# Patient Record
Sex: Male | Born: 1955
Health system: Southern US, Community
[De-identification: ages and names within clinical notes are randomized; demographics above are authoritative.]

## PROBLEM LIST (undated history)

## (undated) DIAGNOSIS — E785 Hyperlipidemia, unspecified: Secondary | ICD-10-CM

## (undated) DIAGNOSIS — I10 Essential (primary) hypertension: Secondary | ICD-10-CM

## (undated) DIAGNOSIS — N189 Chronic kidney disease, unspecified: Secondary | ICD-10-CM

## (undated) DIAGNOSIS — E039 Hypothyroidism, unspecified: Secondary | ICD-10-CM

## (undated) DIAGNOSIS — G709 Myoneural disorder, unspecified: Secondary | ICD-10-CM

## (undated) DIAGNOSIS — T7840XA Allergy, unspecified, initial encounter: Secondary | ICD-10-CM

## (undated) DIAGNOSIS — M1990 Unspecified osteoarthritis, unspecified site: Secondary | ICD-10-CM

## (undated) DIAGNOSIS — G35 Multiple sclerosis: Secondary | ICD-10-CM

## (undated) HISTORY — DX: Hypothyroidism, unspecified: E03.9

## (undated) HISTORY — DX: Chronic kidney disease, unspecified: N18.9

## (undated) HISTORY — DX: Multiple sclerosis: G35

## (undated) HISTORY — DX: Hyperlipidemia, unspecified: E78.5

## (undated) HISTORY — DX: Allergy, unspecified, initial encounter: T78.40XA

## (undated) HISTORY — PX: COLONOSCOPY: SHX174

## (undated) HISTORY — DX: Unspecified osteoarthritis, unspecified site: M19.90

## (undated) HISTORY — DX: Myoneural disorder, unspecified: G70.9

## (undated) HISTORY — DX: Essential (primary) hypertension: I10

## (undated) HISTORY — PX: OTHER SURGICAL HISTORY: SHX169

---

## 2006-06-20 ENCOUNTER — Ambulatory Visit: Payer: Self-pay | Admitting: Internal Medicine

## 2006-06-20 LAB — CONVERTED CEMR LAB
ALT: 35 units/L (ref 0–40)
BUN: 16 mg/dL (ref 6–23)
Basophils Absolute: 0 10*3/uL (ref 0.0–0.1)
Basophils Relative: 0.6 % (ref 0.0–1.0)
Creatinine, Ser: 1.2 mg/dL (ref 0.4–1.5)
Eosinophil percent: 5.5 % — ABNORMAL HIGH (ref 0.0–5.0)
Glomerular Filtration Rate, Af Am: 82 mL/min/{1.73_m2}
Glucose, Bld: 110 mg/dL — ABNORMAL HIGH (ref 70–99)
HCT: 46 % (ref 39.0–52.0)
Hemoglobin: 15.6 g/dL (ref 13.0–17.0)
Hgb A1c MFr Bld: 6 % (ref 4.6–6.0)
LDL Cholesterol: 92 mg/dL (ref 0–99)
Lymphocytes Relative: 26.8 % (ref 12.0–46.0)
MCHC: 34 g/dL (ref 30.0–36.0)
Monocytes Absolute: 0.8 10*3/uL — ABNORMAL HIGH (ref 0.2–0.7)
Neutro Abs: 3.6 10*3/uL (ref 1.4–7.7)
Platelets: 264 10*3/uL (ref 150–400)
RDW: 12.5 % (ref 11.5–14.6)
Sodium: 140 meq/L (ref 135–145)
Total Bilirubin: 1.6 mg/dL — ABNORMAL HIGH (ref 0.3–1.2)
Total Protein: 7.3 g/dL (ref 6.0–8.3)
WBC: 6.6 10*3/uL (ref 4.5–10.5)

## 2006-06-24 ENCOUNTER — Ambulatory Visit: Payer: Self-pay | Admitting: Internal Medicine

## 2006-07-25 ENCOUNTER — Ambulatory Visit: Payer: Self-pay | Admitting: Gastroenterology

## 2006-08-06 ENCOUNTER — Encounter (INDEPENDENT_AMBULATORY_CARE_PROVIDER_SITE_OTHER): Payer: Self-pay | Admitting: Specialist

## 2006-08-06 ENCOUNTER — Ambulatory Visit: Payer: Self-pay | Admitting: Gastroenterology

## 2007-04-02 DIAGNOSIS — G35 Multiple sclerosis: Secondary | ICD-10-CM | POA: Insufficient documentation

## 2007-04-10 ENCOUNTER — Ambulatory Visit: Payer: Self-pay | Admitting: Internal Medicine

## 2007-04-10 DIAGNOSIS — E785 Hyperlipidemia, unspecified: Secondary | ICD-10-CM | POA: Insufficient documentation

## 2007-04-10 DIAGNOSIS — M109 Gout, unspecified: Secondary | ICD-10-CM | POA: Insufficient documentation

## 2007-05-20 ENCOUNTER — Ambulatory Visit: Payer: Self-pay | Admitting: Internal Medicine

## 2007-07-17 ENCOUNTER — Telehealth: Payer: Self-pay | Admitting: Internal Medicine

## 2007-07-17 ENCOUNTER — Ambulatory Visit: Payer: Self-pay | Admitting: Internal Medicine

## 2007-07-17 DIAGNOSIS — I10 Essential (primary) hypertension: Secondary | ICD-10-CM | POA: Insufficient documentation

## 2007-07-17 LAB — CONVERTED CEMR LAB
Creatinine, Ser: 1.2 mg/dL (ref 0.4–1.5)
GFR calc Af Amer: 81 mL/min
Glucose, Bld: 89 mg/dL (ref 70–99)
Potassium: 4.1 meq/L (ref 3.5–5.1)
Sodium: 142 meq/L (ref 135–145)
TSH: 7.36 microintl units/mL — ABNORMAL HIGH (ref 0.35–5.50)

## 2007-07-23 ENCOUNTER — Ambulatory Visit: Payer: Self-pay

## 2007-07-23 ENCOUNTER — Encounter: Payer: Self-pay | Admitting: Internal Medicine

## 2007-09-16 ENCOUNTER — Ambulatory Visit: Payer: Self-pay | Admitting: Internal Medicine

## 2007-09-16 DIAGNOSIS — E039 Hypothyroidism, unspecified: Secondary | ICD-10-CM | POA: Insufficient documentation

## 2007-09-17 LAB — CONVERTED CEMR LAB
BUN: 20 mg/dL (ref 6–23)
GFR calc non Af Amer: 67 mL/min
TSH: 0.86 microintl units/mL (ref 0.35–5.50)

## 2008-01-05 ENCOUNTER — Telehealth: Payer: Self-pay | Admitting: Internal Medicine

## 2008-04-30 ENCOUNTER — Telehealth: Payer: Self-pay | Admitting: Internal Medicine

## 2008-05-03 ENCOUNTER — Ambulatory Visit: Payer: Self-pay | Admitting: Internal Medicine

## 2009-08-05 ENCOUNTER — Encounter: Payer: Self-pay | Admitting: Internal Medicine

## 2009-08-12 ENCOUNTER — Encounter: Admission: RE | Admit: 2009-08-12 | Discharge: 2009-08-12 | Payer: Self-pay | Admitting: Neurology

## 2009-08-24 ENCOUNTER — Ambulatory Visit: Payer: Self-pay | Admitting: Internal Medicine

## 2009-08-24 LAB — CONVERTED CEMR LAB
AST: 33 units/L (ref 0–37)
Albumin: 4.1 g/dL (ref 3.5–5.2)
Bilirubin Urine: NEGATIVE
Bilirubin, Direct: 0.2 mg/dL (ref 0.0–0.3)
Blood in Urine, dipstick: NEGATIVE
Calcium: 9.6 mg/dL (ref 8.4–10.5)
Chloride: 109 meq/L (ref 96–112)
Cholesterol: 169 mg/dL (ref 0–200)
Eosinophils Relative: 4 % (ref 0.0–5.0)
GFR calc non Af Amer: 66.62 mL/min (ref >60)
Glucose, Bld: 105 mg/dL — ABNORMAL HIGH (ref 70–99)
HCT: 44.6 % (ref 39.0–52.0)
Ketones, urine, test strip: NEGATIVE
Lymphs Abs: 1.3 10*3/uL (ref 0.7–4.0)
Monocytes Relative: 13.6 % — ABNORMAL HIGH (ref 3.0–12.0)
Neutro Abs: 2.6 10*3/uL (ref 1.4–7.7)
Neutrophils Relative %: 53.5 % (ref 43.0–77.0)
Nitrite: NEGATIVE
PSA: 1.03 ng/mL (ref 0.10–4.00)
Protein, U semiquant: NEGATIVE
Sodium: 146 meq/L — ABNORMAL HIGH (ref 135–145)
Specific Gravity, Urine: 1.02
Total Bilirubin: 1.3 mg/dL — ABNORMAL HIGH (ref 0.3–1.2)
Total CHOL/HDL Ratio: 3
Total Protein: 7.8 g/dL (ref 6.0–8.3)
Triglycerides: 96 mg/dL (ref 0.0–149.0)
Urobilinogen, UA: 0.2
VLDL: 19.2 mg/dL (ref 0.0–40.0)
WBC Urine, dipstick: NEGATIVE

## 2009-08-31 ENCOUNTER — Ambulatory Visit: Payer: Self-pay | Admitting: Internal Medicine

## 2009-09-19 ENCOUNTER — Telehealth: Payer: Self-pay | Admitting: Internal Medicine

## 2009-11-01 ENCOUNTER — Encounter: Payer: Self-pay | Admitting: Internal Medicine

## 2010-03-20 ENCOUNTER — Ambulatory Visit: Payer: Self-pay | Admitting: Internal Medicine

## 2010-07-18 NOTE — Letter (Signed)
Summary: [facility]  [facility]   Imported By: Maryln Gottron 11/10/2009 13:52:43  _____________________________________________________________________  External Attachment:    Type:   Image     Comment:   External Document

## 2010-07-18 NOTE — Assessment & Plan Note (Signed)
Summary: CPX // RS   Vital Signs:  Patient profile:   55 year old male Height:      70 inches Weight:      166 pounds BMI:     23.90 Temp:     98.2 degrees F oral Pulse rate:   72 / minute Resp:     12 per minute BP sitting:   136 / 80  (left arm)  Vitals Entered By: Willy Eddy, LPN (August 31, 2009 3:06 PM)  Contraindications/Deferment of Procedures/Staging:    Test/Procedure: FLU VAX    Reason for deferment: patient declined     Test/Procedure: DPT vaccine    Reason for deferment: declined  CC: cpx   CC:  cpx.  History of Present Illness: The pt was asked about all immunizations, health maint. services that are appropriate to their age and was given guidance on diet exercize  and weight management shouder pain with adduction and normal rotator cuff  Preventive Screening-Counseling & Management  Alcohol-Tobacco     Smoking Status: never     Passive Smoke Exposure: no  Problems Prior to Update: 1)  Idiopathic Urticaria  (ICD-708.1) 2)  Hypothyroidism  (ICD-244.9) 3)  Chest Pain Unspecified  (ICD-786.50) 4)  Hypertension  (ICD-401.9) 5)  Sinusitis, Acute Frontal  (ICD-461.1) 6)  Hyperlipidemia  (ICD-272.4) 7)  Gout  (ICD-274.9) 8)  Multiple Sclerosis  (ICD-340)  Current Problems (verified): 1)  Idiopathic Urticaria  (ICD-708.1) 2)  Hypothyroidism  (ICD-244.9) 3)  Chest Pain Unspecified  (ICD-786.50) 4)  Hypertension  (ICD-401.9) 5)  Sinusitis, Acute Frontal  (ICD-461.1) 6)  Hyperlipidemia  (ICD-272.4) 7)  Gout  (ICD-274.9) 8)  Multiple Sclerosis  (ICD-340)  Medications Prior to Update: 1)  Lipitor 20 Mg Tabs (Atorvastatin Calcium) .... Every Other Day 2)  Allopurinol 300 Mg  Tabs (Allopurinol) .... Prn 3)  Betaseron 0.3 Mg  Solr (Interferon Beta-1b) .... Every Other Day 4)  Advil 200 Mg  Tabs (Ibuprofen) .... As Needed 5)  Micardis Hct 40-12.5 Mg  Tabs (Telmisartan-Hctz) .Marland Kitchen.. 1 Once Daily 6)  Synthroid 50 Mcg  Tabs (Levothyroxine Sodium) .Marland Kitchen.. 1 30  Minutes Before Breakfast 7)  Xyzal 5 Mg Tabs (Levocetirizine Dihydrochloride) .... One By Mouth Bid 8)  Mometasone Furoate 0.1 % Soln (Mometasone Furoate) .... Apply To Arms  and Site of Itching Daily  Current Medications (verified): 1)  Lipitor 20 Mg Tabs (Atorvastatin Calcium) .... Every Other Day 2)  Allopurinol 300 Mg  Tabs (Allopurinol) .... Prn 3)  Betaseron 0.3 Mg  Solr (Interferon Beta-1b) .... Every Other Day 4)  Advil 200 Mg  Tabs (Ibuprofen) .... As Needed 5)  Micardis Hct 40-12.5 Mg  Tabs (Telmisartan-Hctz) .Marland Kitchen.. 1 Once Daily 6)  Synthroid 50 Mcg  Tabs (Levothyroxine Sodium) .Marland Kitchen.. 1 30 Minutes Before Breakfast  Allergies (verified): No Known Drug Allergies  Past History:  Social History: Last updated: 04/10/2007 Occupation: Married Never Smoked Alcohol use-yes Drug use-no Regular exercise-yes  Risk Factors: Exercise: yes (04/10/2007)  Risk Factors: Smoking Status: never (08/31/2009) Passive Smoke Exposure: no (08/31/2009)  Past medical, surgical, family and social histories (including risk factors) reviewed, and no changes noted (except as noted below).  Past Medical History: Reviewed history from 07/17/2007 and no changes required. MS Gout Hyperlipidemia Hypertension  Family History: Reviewed history and no changes required.  Social History: Reviewed history from 04/10/2007 and no changes required. Occupation: Married Never Smoked Alcohol use-yes Drug use-no Regular exercise-yes  Review of Systems  The patient denies anorexia, fever, weight  loss, weight gain, vision loss, decreased hearing, hoarseness, chest pain, syncope, dyspnea on exertion, peripheral edema, prolonged cough, headaches, hemoptysis, abdominal pain, melena, hematochezia, severe indigestion/heartburn, hematuria, incontinence, genital sores, muscle weakness, suspicious skin lesions, transient blindness, difficulty walking, depression, unusual weight change, abnormal bleeding, enlarged  lymph nodes, angioedema, breast masses, and testicular masses.    Physical Exam  General:  Well-developed,well-nourished,in no acute distress; alert,appropriate and cooperative throughout examination Head:  Normocephalic and atraumatic without obvious abnormalities. No apparent alopecia or balding. Eyes:  pupils equal and pupils round.   Ears:  R ear normal and no external deformities.   Nose:  no external deformity and no nasal discharge.   Mouth:  good dentition and pharynx pink and moist.   Neck:  No deformities, masses, or tenderness noted. Chest Wall:  No deformities, masses, tenderness or gynecomastia noted. Breasts:  no gynecomastia and no masses.   Lungs:  normal respiratory effort and no wheezes.   Heart:  normal rate and no murmur.   Abdomen:  Bowel sounds positive,abdomen soft and non-tender without masses, organomegaly or hernias noted. Rectal:  no masses and external hemorrhoid(s).   Genitalia:  circumcised and no urethral discharge.   Prostate:  no gland enlargement and no nodules.   Msk:  decreased ROM, joint tenderness, and joint swelling.  left shoulder Extremities:  No clubbing, cyanosis, edema, or deformity noted with normal full range of motion of all joints.   Neurologic:  No cranial nerve deficits noted. Station and gait are normal. Plantar reflexes are down-going bilaterally. DTRs are symmetrical throughout. Sensory, motor and coordinative functions appear intact.   Impression & Recommendations:  Problem # 1:  PREVENTIVE HEALTH CARE (ICD-V70.0) .cpx Colonoscopy: historical (06/18/2006) Td Booster: given (06/18/1998)   Flu Vax: Fluvax MCR (05/20/2007)   Chol: 169 (08/24/2009)   HDL: 51.50 (08/24/2009)   LDL: 98 (08/24/2009)   TG: 96.0 (08/24/2009) TSH: 1.62 (08/24/2009)   HgbA1C: 6.0 (06/24/2006)   PSA: 1.03 (08/24/2009)  Discussed using sunscreen, use of alcohol, drug use, self testicular exam, routine dental care, routine eye care, routine physical exam, seat  belts, multiple vitamins, osteoporosis prevention, adequate calcium intake in diet, and recommendations for immunizations.  Discussed exercise and checking cholesterol.  Discussed gun safety, safe sex, and contraception. Also recommend checking PSA.  Problem # 2:  SHOULDER IMPINGEMENT SYNDROME, LEFT (ICD-726.2) Informed consent obtained and then the joint was prepped in a sterile manor and 40 mg depo and 1/2 cc 1% lidocaine injected into the synovial space. After care discussed. Pt tolerated procedure well.  Orders: Joint Aspirate / Injection, Large (20610) Depo- Medrol 40mg  (J1030)  Complete Medication List: 1)  Lipitor 20 Mg Tabs (Atorvastatin calcium) .... Every other day 2)  Allopurinol 300 Mg Tabs (Allopurinol) .... Prn 3)  Betaseron 0.3 Mg Solr (Interferon beta-1b) .... Every other day 4)  Advil 200 Mg Tabs (Ibuprofen) .... As needed 5)  Micardis Hct 40-12.5 Mg Tabs (Telmisartan-hctz) .Marland Kitchen.. 1 once daily 6)  Synthroid 50 Mcg Tabs (Levothyroxine sodium) .Marland Kitchen.. 1 30 minutes before breakfast  Patient Instructions: 1)  Please schedule a follow-up appointment in 1 year. CPX Prescriptions: LIPITOR 20 MG TABS (ATORVASTATIN CALCIUM) every other day  #30 x 11   Entered and Authorized by:   Stacie Glaze MD   Signed by:   Stacie Glaze MD on 08/31/2009   Method used:   Electronically to        [facility] [address] [address] [phone]* (retail)  [address]       [place], [place]  [address]       Ph: [phone]       Fax: [phone]   RxID:   [id number] SYNTHROID 50 MCG  TABS (LEVOTHYROXINE SODIUM) 1 30 minutes before breakfast  #90 x 3   Entered and Authorized by:   Stacie Glaze MD   Signed by:   Stacie Glaze MD on 08/31/2009   Method used:   Faxed to ...       [facility] (mail-order)             , [place]         Ph:        Fax: [phone]   RxID:   [id number] MICARDIS HCT 40-12.5 MG  TABS (TELMISARTAN-HCTZ) 1 once daily  #90 x 3   Entered and Authorized by:   Stacie Glaze  MD   Signed by:   Stacie Glaze MD on 08/31/2009   Method used:   Faxed to ...       [facility] (mail-order)             , [place]         Ph:        Fax: [phone]   RxID:   [id number]     Prevention & Chronic Care Immunizations   Influenza vaccine: Fluvax MCR  (05/20/2007)   Influenza vaccine deferral: patient declined  (08/31/2009)   Influenza vaccine due: 02/17/2011    Tetanus booster: 06/18/1998: given    Pneumococcal vaccine: Not documented  Colorectal Screening   Hemoccult: Not documented    Colonoscopy: historical  (06/18/2006)  Other Screening   PSA: 1.03  (08/24/2009)   Smoking status: never  (08/31/2009)  Lipids   Total Cholesterol: 169  (08/24/2009)   LDL: 98  (08/24/2009)   LDL Direct: Not documented   HDL: 51.50  (08/24/2009)   Triglycerides: 96.0  (08/24/2009)    SGOT (AST): 33  (08/24/2009)   SGPT (ALT): 38  (08/24/2009)   Alkaline phosphatase: 67  (08/24/2009)   Total bilirubin: 1.3  (08/24/2009)  Hypertension   Last Blood Pressure: 136 / 80  (08/31/2009)   Serum creatinine: 1.2  (08/24/2009)   Serum potassium 5.1  (08/24/2009)  Self-Management Support :    Hypertension self-management support: Not documented    Lipid self-management support: Not documented     Appended Document: Orders Update     Clinical Lists Changes  Orders: Added new Service order of Tdap => 60yrs IM (51761) - Signed Added new Service order of Admin 1st Vaccine (60737) - Signed Observations: Added new observation of TD BOOSTERLO: [id number] (08/31/2009 16:12) Added new observation of TD BOOST EXP: 08/13/2011 (08/31/2009 16:12) Added new observation of TD BOOSTERBY: Willy Eddy, LPN (62/70/3500 16:12) Added new observation of TD BOOSTERRT: IM (08/31/2009 16:12) Added new observation of TDBOOSTERDSE: 0.5 ml (08/31/2009 16:12) Added new observation of TD BOOSTERMF: GlaxoSmithKline (08/31/2009 16:12) Added new observation of TD BOOST SIT: left  deltoid (08/31/2009 16:12) Added new observation of TD BOOSTER: Tdap (08/31/2009 16:12)       Immunizations Administered:  Tetanus Vaccine:    Vaccine Type: Tdap    Site: left deltoid    Mfr: GlaxoSmithKline    Dose: 0.5 ml    Route: IM    Given by: Willy Eddy, LPN    Exp. Date: 08/13/2011    Lot #: [id number]

## 2010-07-18 NOTE — Assessment & Plan Note (Signed)
Summary: consult re: rash pts had for approx 1 month/pt req flu shot/cjr   Vital Signs:  Patient profile:   55 year old male Weight:      168 pounds Temp:     97.9 degrees F oral BP sitting:   110 / 80  (left arm) Cuff size:   regular  Vitals Entered By: Duard Brady LPN (March 20, 2010 8:13 AM) CC: rash x several mos Is Patient Diabetic? No Flu Vaccine Consent Questions     Do you have a history of severe allergic reactions to this vaccine? no    Any prior history of allergic reactions to egg and/or gelatin? no    Do you have a sensitivity to the preservative Thimersol? no    Do you have a past history of Guillan-Barre Syndrome? no    Do you currently have an acute febrile illness? no    Have you ever had a severe reaction to latex? no    Vaccine information given and explained to patient? yes    Are you currently pregnant? no    Lot Number:[id number]   Exp Date:12/16/2010   Site Given  Left Deltoid IM   CC:  rash x several mos.  History of Present Illness: 55 year old patient who has a history of idiopathic urticaria who presents with a 4-week history of a pruritic rash involving left lower arm, right thigh and also  the scalp area.  He has been using topical hydrocortisone with benefit.  He has treated hypertension, hypothyroidism, and stable multiple sclerosis.  He has not been using any antihistamines.  Allergies (verified): No Known Drug Allergies  Past History:  Past Medical History: Reviewed history from 07/17/2007 and no changes required. MS Gout Hyperlipidemia Hypertension  Review of Systems       The patient complains of suspicious skin lesions.  The patient denies anorexia, fever, weight loss, weight gain, vision loss, decreased hearing, hoarseness, chest pain, syncope, dyspnea on exertion, peripheral edema, prolonged cough, headaches, hemoptysis, abdominal pain, melena, hematochezia, severe indigestion/heartburn, hematuria, incontinence, genital  sores, muscle weakness, transient blindness, difficulty walking, depression, unusual weight change, abnormal bleeding, enlarged lymph nodes, angioedema, breast masses, and testicular masses.    Physical Exam  General:  Well-developed,well-nourished,in no acute distress; alert,appropriate and cooperative throughout examination; 130/80 Skin:  scattered raised, erythematous urticarial lesions involving his left lower arm and right thigh area   Impression & Recommendations:  Problem # 1:  IDIOPATHIC URTICARIA (ICD-708.1)  Orders: Admin 1st Vaccine (16109) Flu Vaccine 37yrs + (60454)  Problem # 2:  HYPERTENSION (ICD-401.9)  His updated medication list for this problem includes:    Micardis Hct 40-12.5 Mg Tabs (Telmisartan-hctz) .Marland Kitchen... 1 once daily  Orders: Admin 1st Vaccine (09811) Flu Vaccine 53yrs + (91478)  Complete Medication List: 1)  Lipitor 20 Mg Tabs (Atorvastatin calcium) .... Every other day 2)  Allopurinol 300 Mg Tabs (Allopurinol) .... Prn 3)  Betaseron 0.3 Mg Solr (Interferon beta-1b) .... Every other day 4)  Advil 200 Mg Tabs (Ibuprofen) .... As needed 5)  Micardis Hct 40-12.5 Mg Tabs (Telmisartan-hctz) .Marland Kitchen.. 1 once daily 6)  Synthroid 50 Mcg Tabs (Levothyroxine sodium) .Marland Kitchen.. 1 30 minutes before breakfast  Patient Instructions: 1)  Please schedule a follow-up appointment in 6 months. 2)  Limit your Sodium (Salt) to less than 2 grams a day(slightly less than 1/2 a teaspoon) to prevent fluid retention, swelling, or worsening of symptoms. 3)  It is important that you exercise regularly at least 20 minutes 5  times a week. If you develop chest pain, have severe difficulty breathing, or feel very tired , stop exercising immediately and seek medical attention.

## 2010-07-18 NOTE — Consult Note (Signed)
Summary: [facility]  [facility]   Imported By: Maryln Gottron 08/11/2009 15:32:28  _____________________________________________________________________  External Attachment:    Type:   Image     Comment:   External Document

## 2010-07-18 NOTE — Progress Notes (Signed)
Summary: please return call  Phone Note Call from Patient Call back at Work Phone [phone]   Caller: Patient---live call Reason for Call: Talk to Nurse Summary of Call: wants Bonnye to return call about meds. Initial call taken by: Warnell Forester,  September 19, 2009 10:04 AM  Follow-up for Phone Call        scripts out front for pt Follow-up by: Willy Eddy, LPN,  September 19, 2009 11:45 AM    Prescriptions: SYNTHROID 50 MCG  TABS (LEVOTHYROXINE SODIUM) 1 30 minutes before breakfast  #90 x 3   Entered by:   Willy Eddy, LPN   Authorized by:   Stacie Glaze MD   Signed by:   Willy Eddy, LPN on 09/81/1914   Method used:   Print then Give to Patient   RxID:   [id number] MICARDIS HCT 40-12.5 MG  TABS (TELMISARTAN-HCTZ) 1 once daily  #90 x 3   Entered by:   Willy Eddy, LPN   Authorized by:   Stacie Glaze MD   Signed by:   Willy Eddy, LPN on 69/62/9528   Method used:   Print then Give to Patient   RxID:   [id number] BETASERON 0.3 MG  SOLR (INTERFERON BETA-1B) every other day  #45 x 3   Entered by:   Willy Eddy, LPN   Authorized by:   Stacie Glaze MD   Signed by:   Willy Eddy, LPN on 44/08/4740   Method used:   Print then Give to Patient   RxID:   [id number] ALLOPURINOL 300 MG  TABS (ALLOPURINOL) prn  #90 x 3   Entered by:   Willy Eddy, LPN   Authorized by:   Stacie Glaze MD   Signed by:   Willy Eddy, LPN on 88/41/6606   Method used:   Print then Give to Patient   RxID:   [id number] LIPITOR 20 MG TABS (ATORVASTATIN CALCIUM) every other day  #90 x 11   Entered by:   Willy Eddy, LPN   Authorized by:   Stacie Glaze MD   Signed by:   Willy Eddy, LPN on 20/25/4270   Method used:   Print then Give to Patient   RxID:   [id number]

## 2010-10-14 ENCOUNTER — Other Ambulatory Visit: Payer: Self-pay | Admitting: Internal Medicine

## 2010-11-13 ENCOUNTER — Other Ambulatory Visit: Payer: Self-pay | Admitting: Internal Medicine

## 2010-11-16 ENCOUNTER — Other Ambulatory Visit: Payer: Self-pay | Admitting: Internal Medicine

## 2011-03-23 ENCOUNTER — Other Ambulatory Visit (INDEPENDENT_AMBULATORY_CARE_PROVIDER_SITE_OTHER): Payer: 59

## 2011-03-23 DIAGNOSIS — Z0000 Encounter for general adult medical examination without abnormal findings: Secondary | ICD-10-CM

## 2011-03-23 LAB — HEPATIC FUNCTION PANEL
ALT: 23 U/L (ref 0–53)
AST: 21 U/L (ref 0–37)
Alkaline Phosphatase: 59 U/L (ref 39–117)
Bilirubin, Direct: 0.2 mg/dL (ref 0.0–0.3)
Total Bilirubin: 1.6 mg/dL — ABNORMAL HIGH (ref 0.3–1.2)

## 2011-03-23 LAB — BASIC METABOLIC PANEL
BUN: 25 mg/dL — ABNORMAL HIGH (ref 6–23)
Calcium: 9.3 mg/dL (ref 8.4–10.5)
GFR: 60.94 mL/min (ref >60.00)
Potassium: 4.7 mEq/L (ref 3.5–5.1)

## 2011-03-23 LAB — POCT URINALYSIS DIPSTICK
Blood, UA: NEGATIVE
Nitrite, UA: NEGATIVE
Spec Grav, UA: 1.02
pH, UA: 5.5

## 2011-03-23 LAB — LIPID PANEL
LDL Cholesterol: 102 mg/dL — ABNORMAL HIGH (ref 0–99)
Total CHOL/HDL Ratio: 4
Triglycerides: 92 mg/dL (ref 0.0–149.0)
VLDL: 18.4 mg/dL (ref 0.0–40.0)

## 2011-03-23 LAB — CBC WITH DIFFERENTIAL/PLATELET
HCT: 45.3 % (ref 39.0–52.0)
Hemoglobin: 15 g/dL (ref 13.0–17.0)
MCV: 93.1 fl (ref 78.0–100.0)
Monocytes Absolute: 0.8 10*3/uL (ref 0.1–1.0)
Neutrophils Relative %: 60.5 % (ref 43.0–77.0)
Platelets: 254 10*3/uL (ref 150.0–400.0)

## 2011-03-30 ENCOUNTER — Encounter: Payer: Self-pay | Admitting: Internal Medicine

## 2011-03-30 ENCOUNTER — Ambulatory Visit (INDEPENDENT_AMBULATORY_CARE_PROVIDER_SITE_OTHER): Payer: 59 | Admitting: Internal Medicine

## 2011-03-30 VITALS — BP 126/84 | HR 76 | Temp 98.2°F | Resp 16 | Ht 70.0 in | Wt 168.0 lb

## 2011-03-30 DIAGNOSIS — N419 Inflammatory disease of prostate, unspecified: Secondary | ICD-10-CM

## 2011-03-30 DIAGNOSIS — Z0000 Encounter for general adult medical examination without abnormal findings: Secondary | ICD-10-CM

## 2011-03-30 DIAGNOSIS — Z23 Encounter for immunization: Secondary | ICD-10-CM

## 2011-03-30 DIAGNOSIS — R972 Elevated prostate specific antigen [PSA]: Secondary | ICD-10-CM

## 2011-03-30 MED ORDER — CIPROFLOXACIN HCL 250 MG PO TABS: 500.0000 mg | ORAL_TABLET | Freq: Two times a day (BID) | ORAL | Status: DC

## 2011-03-30 MED ORDER — CIPROFLOXACIN HCL 500 MG PO TABS: 500.0000 mg | ORAL_TABLET | Freq: Two times a day (BID) | ORAL | Status: AC

## 2011-03-30 NOTE — Progress Notes (Signed)
Subjective:    Patient ID: Adam Oliver, male    DOB: Dec 31, 1955, 55 y.o.   MRN: [id number]  HPI Patient is a 55 year old male who presents for his annual examination he is known to have hypothyroidism hyperlipidemia and history of gout and history of multiple sclerosis currently in remission.  He has moderate stage II hypertension that appears to be well-controlled   Review of Systems  Constitutional: Negative for fever and fatigue.  HENT: Negative for hearing loss, congestion, neck pain and postnasal drip.   Eyes: Negative for discharge, redness and visual disturbance.  Respiratory: Negative for cough, shortness of breath and wheezing.   Cardiovascular: Negative for leg swelling.  Gastrointestinal: Negative for abdominal pain, constipation and abdominal distention.  Genitourinary: Negative for urgency and frequency.  Musculoskeletal: Negative for joint swelling and arthralgias.  Skin: Negative for color change and rash.  Neurological: Negative for weakness and light-headedness.  Hematological: Negative for adenopathy.  Psychiatric/Behavioral: Negative for behavioral problems.   Past Medical History  Diagnosis Date  . Multiple sclerosis   . Gout   . Hyperlipidemia   . Hypertension     History   Social History  . Marital Status: Married    Spouse Name: N/A    Number of Children: N/A  . Years of Education: N/A   Occupational History  . attorney    Social History Main Topics  . Smoking status: Never Smoker   . Smokeless tobacco: Not on file  . Alcohol Use: Yes  . Drug Use: No  . Sexually Active: Not on file   Other Topics Concern  . Not on file   Social History Narrative  . No narrative on file    No past surgical history on file.  No family history on file.  No Known Allergies  Current Outpatient Prescriptions on File Prior to Visit  Medication Sig Dispense Refill  . allopurinol (ZYLOPRIM) 300 MG tablet TAKE AS NEEDED AS DIRECTED  90 tablet  2  .  interferon beta-1b (BETASERON) 0.3 MG injection Inject 0.25 mg into the skin every other day.        Marland Kitchen LIPITOR 20 MG tablet TAKE 1 TABLET EVERY OTHER DAY  90 tablet  10    BP 126/84  Pulse 76  Temp 98.2 F (36.8 C)  Resp 16  Ht 5\' 10"  (1.778 m)  Wt 168 lb (76.204 kg)  BMI 24.11 kg/m2        Objective:   Physical Exam  Nursing note and vitals reviewed. Constitutional: He appears well-developed and well-nourished.  HENT:  Head: Normocephalic and atraumatic.  Eyes: Conjunctivae are normal. Pupils are equal, round, and reactive to light.  Neck: Normal range of motion. Neck supple.  Cardiovascular: Normal rate and regular rhythm.   Pulmonary/Chest: Effort normal and breath sounds normal.  Abdominal: Soft. Bowel sounds are normal.  Genitourinary: Rectum normal and prostate normal.  Musculoskeletal: Normal range of motion.  Neurological: He is alert.  Skin: Skin is warm and dry.  Psychiatric: He has a normal mood and affect. His behavior is normal.          Assessment & Plan:   Patient presents for yearly preventative medicine examination.   all immunizations and health maintenance protocols were reviewed with the patient and they are up to date with these protocols.   screening laboratory values were reviewed with the patient including screening of hyperlipidemia PSA renal function and hepatic function.   There medications past medical history social history problem  list and allergies were reviewed in detail.   Goals were established with regard to weight loss exercise diet in compliance with medications  Generally his blood work looks normal there is a slight increase in the PSA that is consistent and velocity with benign prostatic hypertrophy.  His liver functions are normal slight elevation in bilirubin which has been seen in prior blood work and may represent [employer]. His cholesterol is generally within normal range the slight elevation in LDL that would be best  addressed by a mild weight loss.  His blood pressure stable on Micardis  On examination the elevation of PSA is best explained by mild prostatitis he'll be started on an antibiotic and repeat PSA drawn

## 2011-03-30 NOTE — Patient Instructions (Signed)
Finish the antibiotics that we have given you for an extra week and call back to report how the nighttime urination is and how any other problem may be faring.  If you are improved significantly we will give me a 30 day prescription for doxycycline to take twice a day.  At the end of the sixth 3 weeks we will ask you to come back to the laboratory for a PSA and we will give you the report of that number if it is significantly decreased then there is no further evaluation needed.

## 2011-04-02 ENCOUNTER — Telehealth: Payer: Self-pay | Admitting: Internal Medicine

## 2011-04-02 NOTE — Telephone Encounter (Signed)
Sent in again 

## 2011-04-02 NOTE — Telephone Encounter (Signed)
Pt came by office and said that he checked with the [facility] on [address] re: Cipro and was told by pharmacist that they had not rcvd a script for this med from Dr Lovell Sheehan. Pls call in ciprofloxacin (CIPRO) 500 MG tablet asap today.

## 2011-04-30 ENCOUNTER — Telehealth: Payer: Self-pay | Admitting: Internal Medicine

## 2011-04-30 ENCOUNTER — Other Ambulatory Visit (INDEPENDENT_AMBULATORY_CARE_PROVIDER_SITE_OTHER): Payer: 59

## 2011-04-30 DIAGNOSIS — R972 Elevated prostate specific antigen [PSA]: Secondary | ICD-10-CM

## 2011-04-30 NOTE — Telephone Encounter (Signed)
Requesting levitra refill

## 2011-04-30 NOTE — Telephone Encounter (Signed)
Pt requesting phone call. Would not give me reason for return call

## 2011-05-01 LAB — PSA: PSA: 1.43 ng/mL (ref 0.10–4.00)

## 2011-05-03 ENCOUNTER — Other Ambulatory Visit: Payer: Self-pay | Admitting: Internal Medicine

## 2011-05-03 ENCOUNTER — Telehealth: Payer: Self-pay | Admitting: *Deleted

## 2011-05-03 DIAGNOSIS — R972 Elevated prostate specific antigen [PSA]: Secondary | ICD-10-CM

## 2011-05-03 NOTE — Telephone Encounter (Signed)
Opened in error

## 2011-05-04 ENCOUNTER — Telehealth: Payer: Self-pay | Admitting: Internal Medicine

## 2011-05-04 NOTE — Telephone Encounter (Signed)
PT COM PLAINING THAT LEVITRA COST 60 DOLLARS AND HE ONLY GOT 3-i TOLD HIM that iI called in 30 and that was something he would have to talke to pharmacy and insurance aboutI

## 2011-05-04 NOTE — Telephone Encounter (Signed)
Wants to speak with [facility] about a rx that was sent through. Refused to leave further message. Thanks.

## 2011-05-07 ENCOUNTER — Telehealth: Payer: Self-pay | Admitting: Family Medicine

## 2011-05-07 NOTE — Telephone Encounter (Signed)
Thanks [employer]

## 2011-05-07 NOTE — Telephone Encounter (Signed)
An update on this issue. I will be calling the pt, but in the event you end up speaking with him. My original thought was correct - nothing will help his issue with Levitra. His pharmacy benefit is [employer], whether he goes through [employer] order Rx or a Tour manager, such as Therapist, occupational. I tried explaining this to him, but I don't think he understood. He asked that I call [employer], because he says they told him he could get it authorized if we did a PA & then get the # of pills he wanted. I called [employer] - Levitra is a Tier 3 drug, which is only covered for 3 tablets in a 31 day rolling period. His co-pay on this is $60. It was the same situation with other ED drugs I had them look at. I will call Adam Oliver tomorrow and relay this info. Per [employer], the only option he has is to write [facility] a formal letter of appeal.

## 2011-05-07 NOTE — Telephone Encounter (Signed)
Bonnye,  I did not see a rejected claim for Adam Oliver's Levitra through [employer], so I called the pharmacy. They told me that [employer] will only pay for 3 pills, and that [facility] has a $60 copay for this Rx. [employer] is upset because he has to pay so much for so little. I don't think a prior Berkley Harvey is appropriate in this instance, because [employer] IS paying - just not for what he wants. The better solution may be for him to change to a less expensive ED Rx. I'll hang on to this info. Please advise.

## 2011-05-08 NOTE — Telephone Encounter (Signed)
Spoke with Mr. Amsden and relayed the info from [facility]. He states Levitra is for BPH, not ED, and is unhappy with decision of insurance. May call [facility] - may call us back.

## 2011-06-25 ENCOUNTER — Other Ambulatory Visit: Payer: Self-pay | Admitting: Internal Medicine

## 2011-08-13 ENCOUNTER — Ambulatory Visit (INDEPENDENT_AMBULATORY_CARE_PROVIDER_SITE_OTHER): Payer: 59 | Admitting: Family

## 2011-08-13 ENCOUNTER — Encounter: Payer: Self-pay | Admitting: Family

## 2011-08-13 DIAGNOSIS — R05 Cough: Secondary | ICD-10-CM

## 2011-08-13 DIAGNOSIS — J069 Acute upper respiratory infection, unspecified: Secondary | ICD-10-CM

## 2011-08-13 DIAGNOSIS — R059 Cough, unspecified: Secondary | ICD-10-CM

## 2011-08-13 MED ORDER — PREDNISONE 20 MG PO TABS: ORAL_TABLET | ORAL | Status: AC

## 2011-08-13 NOTE — Progress Notes (Signed)
  Subjective:    Patient ID: Adam Oliver, male    DOB: Aug 12, 1955, 56 y.o.   MRN: [id number]  HPI 56 year old white male, nonsmoker, patient of Dr. Lovell Sheehan presents today with complaints of sinus congestion, cough 1 for about a week cough is productive clear sputum. TIMI flow and Zyrtec with no relief. He denies any fever, muscle aches, pains or shortness of breath, chest pain or edema.   Review of Systems  Constitutional: Negative.   HENT: Positive for congestion, sneezing and postnasal drip.   Eyes: Negative.   Respiratory: Positive for cough.   Cardiovascular: Negative.   Gastrointestinal: Negative.   Musculoskeletal: Negative.   Skin: Negative.   Neurological: Negative.   Hematological: Negative.   Psychiatric/Behavioral: Negative.        Objective:   Physical Exam  Constitutional: He is oriented to person, place, and time. He appears well-developed and well-nourished.  HENT:  Right Ear: External ear normal.  Left Ear: External ear normal.  Nose: Nose normal.  Mouth/Throat: Oropharynx is clear and moist.  Neck: Normal range of motion. Neck supple.  Cardiovascular: Normal rate, regular rhythm and normal heart sounds.   Pulmonary/Chest: Effort normal and breath sounds normal.  Musculoskeletal: Normal range of motion.  Neurological: He is alert and oriented to person, place, and time.  Skin: Skin is warm and dry.  Psychiatric: He has a normal mood and affect.          Assessment & Plan:   assessment: Upper respiratory infection, cough  Plan: Prednisone 60x3, 40x3, 20x3. Continue Zyrtec. Call the office if symptoms worsen or persist, recheck as scheduled and when necessary.

## 2011-08-13 NOTE — Patient Instructions (Signed)

## 2011-08-20 ENCOUNTER — Other Ambulatory Visit: Payer: Self-pay | Admitting: Internal Medicine

## 2011-09-19 ENCOUNTER — Encounter: Payer: Self-pay | Admitting: Internal Medicine

## 2011-09-28 ENCOUNTER — Ambulatory Visit: Payer: 59 | Admitting: Internal Medicine

## 2011-10-01 ENCOUNTER — Encounter: Payer: Self-pay | Admitting: Internal Medicine

## 2011-10-01 ENCOUNTER — Ambulatory Visit (INDEPENDENT_AMBULATORY_CARE_PROVIDER_SITE_OTHER): Payer: 59 | Admitting: Internal Medicine

## 2011-10-01 VITALS — BP 120/82 | HR 68 | Temp 98.3°F | Resp 16 | Ht 70.0 in | Wt 168.0 lb

## 2011-10-01 DIAGNOSIS — I10 Essential (primary) hypertension: Secondary | ICD-10-CM

## 2011-10-01 DIAGNOSIS — E039 Hypothyroidism, unspecified: Secondary | ICD-10-CM

## 2011-10-01 DIAGNOSIS — R972 Elevated prostate specific antigen [PSA]: Secondary | ICD-10-CM

## 2011-10-01 DIAGNOSIS — E785 Hyperlipidemia, unspecified: Secondary | ICD-10-CM

## 2011-10-01 LAB — BASIC METABOLIC PANEL
Creatinine, Ser: 1.2 mg/dL (ref 0.4–1.5)
Potassium: 3.9 mEq/L (ref 3.5–5.1)
Sodium: 140 mEq/L (ref 135–145)

## 2011-10-01 NOTE — Progress Notes (Signed)
Subjective:    Patient ID: Adam Oliver, male    DOB: 02/22/1956, 56 y.o.   MRN: [id number]  HPI Patient is a 57 year old male who presents for followup of hypertension hypothyroidism with a history of MS.  He presents today for his 6 month visit his physicals are normally in October.  Today we will measure of basic metabolic panel to assess renal function and potassium and a TSH to monitor thyroid.  His PSA was moderately elevated at the time of his physical approximately 25% greater than the year before therefore he was referred to Dr. Brunilda Payor and a subsequent free and total PSA showed stability.  The recommendation was yearly PSAs continued vigilance.     Review of Systems  Constitutional: Negative for fever and fatigue.  HENT: Negative for hearing loss, congestion, neck pain and postnasal drip.   Eyes: Negative for discharge, redness and visual disturbance.  Respiratory: Negative for cough, shortness of breath and wheezing.   Cardiovascular: Negative for leg swelling.  Gastrointestinal: Negative for abdominal pain, constipation and abdominal distention.  Genitourinary: Negative for urgency and frequency.  Musculoskeletal: Negative for joint swelling and arthralgias.  Skin: Negative for color change and rash.  Neurological: Negative for weakness and light-headedness.  Hematological: Negative for adenopathy.  Psychiatric/Behavioral: Negative for behavioral problems.   Past Medical History  Diagnosis Date  . Multiple sclerosis   . Gout   . Hyperlipidemia   . Hypertension     History   Social History  . Marital Status: Married    Spouse Name: N/A    Number of Children: N/A  . Years of Education: N/A   Occupational History  . attorney    Social History Main Topics  . Smoking status: Never Smoker   . Smokeless tobacco: Not on file  . Alcohol Use: Yes  . Drug Use: No  . Sexually Active: Not on file   Other Topics Concern  . Not on file   Social History  Narrative  . No narrative on file    No past surgical history on file.  No family history on file.  No Known Allergies  Current Outpatient Prescriptions on File Prior to Visit  Medication Sig Dispense Refill  . allopurinol (ZYLOPRIM) 300 MG tablet TAKE AS NEEDED AS DIRECTED  90 tablet  2  . interferon beta-1b (BETASERON) 0.3 MG injection Inject 0.25 mg into the skin every other day.        . levothyroxine (SYNTHROID, LEVOTHROID) 50 MCG tablet TAKE 1 TABLET 30 MINUTES BEFORE BREAKFAST  90 tablet  1  . LIPITOR 20 MG tablet TAKE 1 TABLET EVERY OTHER DAY  90 tablet  10  . MICARDIS HCT 40-12.5 MG per tablet TAKE 1 TABLET ONCE DAILY  90 tablet  1    BP 120/82  Pulse 68  Temp 98.3 F (36.8 C)  Resp 16  Ht 5\' 10"  (1.778 m)  Wt 168 lb (76.204 kg)  BMI 24.11 kg/m2       Objective:   Physical Exam  Nursing note and vitals reviewed. Constitutional: He appears well-developed and well-nourished.  HENT:  Head: Normocephalic and atraumatic.  Eyes: Conjunctivae are normal. Pupils are equal, round, and reactive to light.  Neck: Normal range of motion. Neck supple.  Cardiovascular: Normal rate and regular rhythm.   Pulmonary/Chest: Effort normal and breath sounds normal.  Abdominal: Soft. Bowel sounds are normal.          Assessment & Plan:  Patient is stable from the  standpoint of his thyroid from gout and from his blood pressure.  His lipids have been consistently stable and will be measured in October his PSA was reviewed by the consultant and deemed to be a normal pump in PSA

## 2011-10-01 NOTE — Patient Instructions (Signed)
The patient is instructed to continue all medications as prescribed. Schedule followup with check out clerk upon leaving the clinic  

## 2011-12-07 ENCOUNTER — Other Ambulatory Visit: Payer: Self-pay | Admitting: Internal Medicine

## 2011-12-07 NOTE — Telephone Encounter (Signed)
Rx[s] Done. 

## 2012-02-15 ENCOUNTER — Other Ambulatory Visit: Payer: Self-pay | Admitting: Internal Medicine

## 2012-02-29 ENCOUNTER — Other Ambulatory Visit: Payer: Self-pay | Admitting: *Deleted

## 2012-02-29 MED ORDER — TELMISARTAN-HCTZ 40-12.5 MG PO TABS: 1.0000 | ORAL_TABLET | Freq: Every day | ORAL | Status: DC

## 2012-06-21 ENCOUNTER — Other Ambulatory Visit: Payer: Self-pay | Admitting: Internal Medicine

## 2012-08-07 ENCOUNTER — Other Ambulatory Visit: Payer: Self-pay | Admitting: Internal Medicine

## 2012-09-09 ENCOUNTER — Encounter: Payer: Self-pay | Admitting: Internal Medicine

## 2012-09-09 ENCOUNTER — Ambulatory Visit (INDEPENDENT_AMBULATORY_CARE_PROVIDER_SITE_OTHER): Payer: 59 | Admitting: Internal Medicine

## 2012-09-09 VITALS — BP 142/100 | HR 98 | Temp 97.7°F | Resp 18 | Wt 174.0 lb

## 2012-09-09 DIAGNOSIS — J069 Acute upper respiratory infection, unspecified: Secondary | ICD-10-CM

## 2012-09-09 DIAGNOSIS — M25511 Pain in right shoulder: Secondary | ICD-10-CM

## 2012-09-09 DIAGNOSIS — I10 Essential (primary) hypertension: Secondary | ICD-10-CM

## 2012-09-09 DIAGNOSIS — G35 Multiple sclerosis: Secondary | ICD-10-CM

## 2012-09-09 DIAGNOSIS — M25519 Pain in unspecified shoulder: Secondary | ICD-10-CM

## 2012-09-09 DIAGNOSIS — G35D Multiple sclerosis, unspecified: Secondary | ICD-10-CM

## 2012-09-09 MED ORDER — AMOXICILLIN-POT CLAVULANATE 875-125 MG PO TABS: 1.0000 | ORAL_TABLET | Freq: Two times a day (BID) | ORAL | Status: DC

## 2012-09-09 NOTE — Patient Instructions (Addendum)
Acute sinusitis symptoms for less than 10 days are generally not helped by antibiotic therapy.  Use saline irrigation, warm  moist compresses and over-the-counter decongestants only as directed.  Call if there is no improvement in 5 to 7 days, or sooner if you develop increasing pain, fever, or any new symptoms.Impingement Syndrome, Rotator Cuff, Bursitis with Rehab Impingement syndrome is a condition that involves inflammation of the tendons of the rotator cuff and the subacromial bursa, that causes pain in the shoulder. The rotator cuff consists of four tendons and muscles that control much of the shoulder and upper arm function. The subacromial bursa is a fluid filled sac that helps reduce friction between the rotator cuff and one of the bones of the shoulder (acromion). Impingement syndrome is usually an overuse injury that causes swelling of the bursa (bursitis), swelling of the tendon (tendonitis), and/or a tear of the tendon (strain). Strains are classified into three categories. Grade 1 strains cause pain, but the tendon is not lengthened. Grade 2 strains include a lengthened ligament, due to the ligament being stretched or partially ruptured. With grade 2 strains there is still function, although the function may be decreased. Grade 3 strains include a complete tear of the tendon or muscle, and function is usually impaired. SYMPTOMS   Pain around the shoulder, often at the outer portion of the upper arm.  Pain that gets worse with shoulder function, especially when reaching overhead or lifting.  Sometimes, aching when not using the arm.  Pain that wakes you up at night.  Sometimes, tenderness, swelling, warmth, or redness over the affected area.  Loss of strength.  Limited motion of the shoulder, especially reaching behind the back (to the back pocket or to unhook bra) or across your body.  Crackling sound (crepitation) when moving the arm.  Biceps tendon pain and inflammation (in the  front of the shoulder). Worse when bending the elbow or lifting. CAUSES  Impingement syndrome is often an overuse injury, in which chronic (repetitive) motions cause the tendons or bursa to become inflamed. A strain occurs when a force is paced on the tendon or muscle that is greater than it can withstand. Common mechanisms of injury include: Stress from sudden increase in duration, frequency, or intensity of training.  Direct hit (trauma) to the shoulder.  Aging, erosion of the tendon with normal use.  Bony bump on shoulder (acromial spur). RISK INCREASES WITH:  Contact sports (football, wrestling, boxing).  Throwing sports (baseball, tennis, volleyball).  Weightlifting and bodybuilding.  Heavy labor.  Previous injury to the rotator cuff, including impingement.  Poor shoulder strength and flexibility.  Failure to warm up properly before activity.  Inadequate protective equipment.  Old age.  Bony bump on shoulder (acromial spur). PREVENTION   Warm up and stretch properly before activity.  Allow for adequate recovery between workouts.  Maintain physical fitness:  Strength, flexibility, and endurance.  Cardiovascular fitness.  Learn and use proper exercise technique. PROGNOSIS  If treated properly, impingement syndrome usually goes away within 6 weeks. Sometimes surgery is required.  RELATED COMPLICATIONS   Longer healing time if not properly treated, or if not given enough time to heal.  Recurring symptoms, that result in a chronic condition.  Shoulder stiffness, frozen shoulder, or loss of motion.  Rotator cuff tendon tear.  Recurring symptoms, especially if activity is resumed too soon, with overuse, with a direct blow, or when using poor technique. TREATMENT  Treatment first involves the use of ice and medicine, to reduce  pain and inflammation. The use of strengthening and stretching exercises may help reduce pain with activity. These exercises may be  performed at home or with a therapist. If non-surgical treatment is unsuccessful after more than 6 months, surgery may be advised. After surgery and rehabilitation, activity is usually possible in 3 months.  MEDICATION  If pain medicine is needed, nonsteroidal anti-inflammatory medicines (aspirin and ibuprofen), or other minor pain relievers (acetaminophen), are often advised.  Do not take pain medicine for 7 days before surgery.  Prescription pain relievers may be given, if your caregiver thinks they are needed. Use only as directed and only as much as you need.  Corticosteroid injections may be given by your caregiver. These injections should be reserved for the most serious cases, because they may only be given a certain number of times. HEAT AND COLD  Cold treatment (icing) should be applied for 10 to 15 minutes every 2 to 3 hours for inflammation and pain, and immediately after activity that aggravates your symptoms. Use ice packs or an ice massage.  Heat treatment may be used before performing stretching and strengthening activities prescribed by your caregiver, physical therapist, or athletic trainer. Use a heat pack or a warm water soak. SEEK MEDICAL CARE IF:   Symptoms get worse or do not improve in 4 to 6 weeks, despite treatment.  New, unexplained symptoms develop. (Drugs used in treatment may produce side effects.) EXERCISES  RANGE OF MOTION (ROM) AND STRETCHING EXERCISES - Impingement Syndrome (Rotator Cuff  Tendinitis, Bursitis) These exercises may help you when beginning to rehabilitate your injury. Your symptoms may go away with or without further involvement from your physician, physical therapist or athletic trainer. While completing these exercises, remember:   Restoring tissue flexibility helps normal motion to return to the joints. This allows healthier, less painful movement and activity.  An effective stretch should be held for at least 30 seconds.  A stretch  should never be painful. You should only feel a gentle lengthening or release in the stretched tissue. STRETCH  Flexion, Standing  Stand with good posture. With an underhand grip on your right / left hand, and an overhand grip on the opposite hand, grasp a broomstick or cane so that your hands are a little more than shoulder width apart.  Keeping your right / left elbow straight and shoulder muscles relaxed, push the stick with your opposite hand, to raise your right / left arm in front of your body and then overhead. Raise your arm until you feel a stretch in your right / left shoulder, but before you have increased shoulder pain.  Try to avoid shrugging your right / left shoulder as your arm rises, by keeping your shoulder blade tucked down and toward your mid-back spine. Hold for __________ seconds.  Slowly return to the starting position. Repeat __________ times. Complete this exercise __________ times per day. STRETCH  Abduction, Supine  Lie on your back. With an underhand grip on your right / left hand and an overhand grip on the opposite hand, grasp a broomstick or cane so that your hands are a little more than shoulder width apart.  Keeping your right / left elbow straight and your shoulder muscles relaxed, push the stick with your opposite hand, to raise your right / left arm out to the side of your body and then overhead. Raise your arm until you feel a stretch in your right / left shoulder, but before you have increased shoulder pain.  Try to  avoid shrugging your right / left shoulder as your arm rises, by keeping your shoulder blade tucked down and toward your mid-back spine. Hold for __________ seconds.  Slowly return to the starting position. Repeat __________ times. Complete this exercise __________ times per day. ROM  Flexion, Active-Assisted  Lie on your back. You may bend your knees for comfort.  Grasp a broomstick or cane so your hands are about shoulder width apart. Your  right / left hand should grip the end of the stick, so that your hand is positioned "thumbs-up," as if you were about to shake hands.  Using your healthy arm to lead, raise your right / left arm overhead, until you feel a gentle stretch in your shoulder. Hold for __________ seconds.  Use the stick to assist in returning your right / left arm to its starting position. Repeat __________ times. Complete this exercise __________ times per day.  ROM - Internal Rotation, Supine   Lie on your back on a firm surface. Place your right / left elbow about 60 degrees away from your side. Elevate your elbow with a folded towel, so that the elbow and shoulder are the same height.  Using a broomstick or cane and your strong arm, pull your right / left hand toward your body until you feel a gentle stretch, but no increase in your shoulder pain. Keep your shoulder and elbow in place throughout the exercise.  Hold for __________ seconds. Slowly return to the starting position. Repeat __________ times. Complete this exercise __________ times per day. STRETCH - Internal Rotation  Place your right / left hand behind your back, palm up.  Throw a towel or belt over your opposite shoulder. Grasp the towel with your right / left hand.  While keeping an upright posture, gently pull up on the towel, until you feel a stretch in the front of your right / left shoulder.  Avoid shrugging your right / left shoulder as your arm rises, by keeping your shoulder blade tucked down and toward your mid-back spine.  Hold for __________ seconds. Release the stretch, by lowering your healthy hand. Repeat __________ times. Complete this exercise __________ times per day. ROM - Internal Rotation   Using an underhand grip, grasp a stick behind your back with both hands.  While standing upright with good posture, slide the stick up your back until you feel a mild stretch in the front of your shoulder.  Hold for __________  seconds. Slowly return to your starting position. Repeat __________ times. Complete this exercise __________ times per day.  STRETCH  Posterior Shoulder Capsule   Stand or sit with good posture. Grasp your right / left elbow and draw it across your chest, keeping it at the same height as your shoulder.  Pull your elbow, so your upper arm comes in closer to your chest. Pull until you feel a gentle stretch in the back of your shoulder.  Hold for __________ seconds. Repeat __________ times. Complete this exercise __________ times per day. STRENGTHENING EXERCISES - Impingement Syndrome (Rotator Cuff Tendinitis, Bursitis) These exercises may help you when beginning to rehabilitate your injury. They may resolve your symptoms with or without further involvement from your physician, physical therapist or athletic trainer. While completing these exercises, remember:  Muscles can gain both the endurance and the strength needed for everyday activities through controlled exercises.  Complete these exercises as instructed by your physician, physical therapist or athletic trainer. Increase the resistance and repetitions only as guided.  You  may experience muscle soreness or fatigue, but the pain or discomfort you are trying to eliminate should never worsen during these exercises. If this pain does get worse, stop and make sure you are following the directions exactly. If the pain is still present after adjustments, discontinue the exercise until you can discuss the trouble with your clinician.  During your recovery, avoid activity or exercises which involve actions that place your injured hand or elbow above your head or behind your back or head. These positions stress the tissues which you are trying to heal. STRENGTH - Scapular Depression and Adduction   With good posture, sit on a firm chair. Support your arms in front of you, with pillows, arm rests, or on a table top. Have your elbows in line with the  sides of your body.  Gently draw your shoulder blades down and toward your mid-back spine. Gradually increase the tension, without tensing the muscles along the top of your shoulders and the back of your neck.  Hold for __________ seconds. Slowly release the tension and relax your muscles completely before starting the next repetition.  After you have practiced this exercise, remove the arm support and complete the exercise in standing as well as sitting position. Repeat __________ times. Complete this exercise __________ times per day.  STRENGTH - Shoulder Abductors, Isometric  With good posture, stand or sit about 4-6 inches from a wall, with your right / left side facing the wall.  Bend your right / left elbow. Gently press your right / left elbow into the wall. Increase the pressure gradually, until you are pressing as hard as you can, without shrugging your shoulder or increasing any shoulder discomfort.  Hold for __________ seconds.  Release the tension slowly. Relax your shoulder muscles completely before you begin the next repetition. Repeat __________ times. Complete this exercise __________ times per day.  STRENGTH - External Rotators, Isometric  Keep your right / left elbow at your side and bend it 90 degrees.  Step into a door frame so that the outside of your right / left wrist can press against the door frame without your upper arm leaving your side.  Gently press your right / left wrist into the door frame, as if you were trying to swing the back of your hand away from your stomach. Gradually increase the tension, until you are pressing as hard as you can, without shrugging your shoulder or increasing any shoulder discomfort.  Hold for __________ seconds.  Release the tension slowly. Relax your shoulder muscles completely before you begin the next repetition. Repeat __________ times. Complete this exercise __________ times per day.  STRENGTH - Supraspinatus   Stand or  sit with good posture. Grasp a __________ weight, or an exercise band or tubing, so that your hand is "thumbs-up," like you are shaking hands.  Slowly lift your right / left arm in a "V" away from your thigh, diagonally into the space between your side and straight ahead. Lift your hand to shoulder height or as far as you can, without increasing any shoulder pain. At first, many people do not lift their hands above shoulder height.  Avoid shrugging your right / left shoulder as your arm rises, by keeping your shoulder blade tucked down and toward your mid-back spine.  Hold for __________ seconds. Control the descent of your hand, as you slowly return to your starting position. Repeat __________ times. Complete this exercise __________ times per day.  STRENGTH - External Rotators  Secure  a rubber exercise band or tubing to a fixed object (table, pole) so that it is at the same height as your right / left elbow when you are standing or sitting on a firm surface.  Stand or sit so that the secured exercise band is at your uninjured side.  Bend your right / left elbow 90 degrees. Place a folded towel or small pillow under your right / left arm, so that your elbow is a few inches away from your side.  Keeping the tension on the exercise band, pull it away from your body, as if pivoting on your elbow. Be sure to keep your body steady, so that the movement is coming only from your rotating shoulder.  Hold for __________ seconds. Release the tension in a controlled manner, as you return to the starting position. Repeat __________ times. Complete this exercise __________ times per day.  STRENGTH - Internal Rotators   Secure a rubber exercise band or tubing to a fixed object (table, pole) so that it is at the same height as your right / left elbow when you are standing or sitting on a firm surface.  Stand or sit so that the secured exercise band is at your right / left side.  Bend your elbow 90  degrees. Place a folded towel or small pillow under your right / left arm so that your elbow is a few inches away from your side.  Keeping the tension on the exercise band, pull it across your body, toward your stomach. Be sure to keep your body steady, so that the movement is coming only from your rotating shoulder.  Hold for __________ seconds. Release the tension in a controlled manner, as you return to the starting position. Repeat __________ times. Complete this exercise __________ times per day.  STRENGTH - Scapular Protractors, Standing   Stand arms length away from a wall. Place your hands on the wall, keeping your elbows straight.  Begin by dropping your shoulder blades down and toward your mid-back spine.  To strengthen your protractors, keep your shoulder blades down, but slide them forward on your rib cage. It will feel as if you are lifting the back of your rib cage away from the wall. This is a subtle motion and can be challenging to complete. Ask your caregiver for further instruction, if you are not sure you are doing the exercise correctly.  Hold for __________ seconds. Slowly return to the starting position, resting the muscles completely before starting the next repetition. Repeat __________ times. Complete this exercise __________ times per day. STRENGTH - Scapular Protractors, Supine  Lie on your back on a firm surface. Extend your right / left arm straight into the air while holding a __________ weight in your hand.  Keeping your head and back in place, lift your shoulder off the floor.  Hold for __________ seconds. Slowly return to the starting position, and allow your muscles to relax completely before starting the next repetition. Repeat __________ times. Complete this exercise __________ times per day. STRENGTH - Scapular Protractors, Quadruped  Get onto your hands and knees, with your shoulders directly over your hands (or as close as you can be,  comfortably).  Keeping your elbows locked, lift the back of your rib cage up into your shoulder blades, so your mid-back rounds out. Keep your neck muscles relaxed.  Hold this position for __________ seconds. Slowly return to the starting position and allow your muscles to relax completely before starting the next repetition. Repeat  __________ times. Complete this exercise __________ times per day.  STRENGTH - Scapular Retractors  Secure a rubber exercise band or tubing to a fixed object (table, pole), so that it is at the height of your shoulders when you are either standing, or sitting on a firm armless chair.  With a palm down grip, grasp an end of the band in each hand. Straighten your elbows and lift your hands straight in front of you, at shoulder height. Step back, away from the secured end of the band, until it becomes tense.  Squeezing your shoulder blades together, draw your elbows back toward your sides, as you bend them. Keep your upper arms lifted away from your body throughout the exercise.  Hold for __________ seconds. Slowly ease the tension on the band, as you reverse the directions and return to the starting position. Repeat __________ times. Complete this exercise __________ times per day. STRENGTH - Shoulder Extensors   Secure a rubber exercise band or tubing to a fixed object (table, pole) so that it is at the height of your shoulders when you are either standing, or sitting on a firm armless chair.  With a thumbs-up grip, grasp an end of the band in each hand. Straighten your elbows and lift your hands straight in front of you, at shoulder height. Step back, away from the secured end of the band, until it becomes tense.  Squeezing your shoulder blades together, pull your hands down to the sides of your thighs. Do not allow your hands to go behind you.  Hold for __________ seconds. Slowly ease the tension on the band, as you reverse the directions and return to the  starting position. Repeat __________ times. Complete this exercise __________ times per day.  STRENGTH - Scapular Retractors and External Rotators   Secure a rubber exercise band or tubing to a fixed object (table, pole) so that it is at the height as your shoulders, when you are either standing, or sitting on a firm armless chair.  With a palm down grip, grasp an end of the band in each hand. Bend your elbows 90 degrees and lift your elbows to shoulder height, at your sides. Step back, away from the secured end of the band, until it becomes tense.  Squeezing your shoulder blades together, rotate your shoulders so that your upper arms and elbows remain stationary, but your fists travel upward to head height.  Hold for __________ seconds. Slowly ease the tension on the band, as you reverse the directions and return to the starting position. Repeat __________ times. Complete this exercise __________ times per day.  STRENGTH - Scapular Retractors and External Rotators, Rowing   Secure a rubber exercise band or tubing to a fixed object (table, pole) so that it is at the height of your shoulders, when you are either standing, or sitting on a firm armless chair.  With a palm down grip, grasp an end of the band in each hand. Straighten your elbows and lift your hands straight in front of you, at shoulder height. Step back, away from the secured end of the band, until it becomes tense.  Step 1: Squeeze your shoulder blades together. Bending your elbows, draw your hands to your chest, as if you are rowing a boat. At the end of this motion, your hands and elbow should be at shoulder height and your elbows should be out to your sides.  Step 2: Rotate your shoulders, to raise your hands above your head. Your forearms  should be vertical and your upper arms should be horizontal.  Hold for __________ seconds. Slowly ease the tension on the band, as you reverse the directions and return to the starting  position. Repeat __________ times. Complete this exercise __________ times per day.  STRENGTH  Scapular Depressors  Find a sturdy chair without wheels, such as a dining room chair.  Keeping your feet on the floor, and your hands on the chair arms, lift your bottom up from the seat, and lock your elbows.  Keeping your elbows straight, allow gravity to pull your body weight down. Your shoulders will rise toward your ears.  Raise your body against gravity by drawing your shoulder blades down your back, shortening the distance between your shoulders and ears. Although your feet should always maintain contact with the floor, your feet should progressively support less body weight, as you get stronger.  Hold for __________ seconds. In a controlled and slow manner, lower your body weight to begin the next repetition. Repeat __________ times. Complete this exercise __________ times per day.  Document Released: 06/04/2005 Document Revised: 08/27/2011 Document Reviewed: 09/16/2008 [facility] Patient Information 2013 [place], [place].

## 2012-09-09 NOTE — Progress Notes (Signed)
  Subjective:    Patient ID: Adam Oliver, male    DOB: Aug 17, 1955, 57 y.o.   MRN: [id number]  HPI  BP Readings from Last 3 Encounters:  09/09/12 142/100  10/01/11 120/82  08/13/11 120/80    Review of Systems     Objective:   Physical Exam        Assessment & Plan:

## 2012-09-09 NOTE — Progress Notes (Signed)
Subjective:    Patient ID: Adam Oliver, male    DOB: 24-Feb-1956, 57 y.o.   MRN: [id number]  HPI  57 year old patient who presents with a 2-3 day history of sinus congestion and sore throat. He was concerned about a sinus infection. He has a history of MS and has been on interferon chronically. No fever. He also has a history of intermittent right shoulder pain with movement. He has a prior history of a left shoulder impingement syndrome.  Past Medical History  Diagnosis Date  . Multiple sclerosis   . Gout   . Hyperlipidemia   . Hypertension     History   Social History  . Marital Status: Married    Spouse Name: N/A    Number of Children: N/A  . Years of Education: N/A   Occupational History  . attorney    Social History Main Topics  . Smoking status: Never Smoker   . Smokeless tobacco: Not on file  . Alcohol Use: Yes  . Drug Use: No  . Sexually Active: Not on file   Other Topics Concern  . Not on file   Social History Narrative  . No narrative on file    No past surgical history on file.  No family history on file.  No Known Allergies  Current Outpatient Prescriptions on File Prior to Visit  Medication Sig Dispense Refill  . allopurinol (ZYLOPRIM) 300 MG tablet TAKE AS NEEDED AS DIRECTED  90 tablet  1  . atorvastatin (LIPITOR) 20 MG tablet Take 1 tablet every other  day  45 tablet  3  . interferon beta-1b (BETASERON) 0.3 MG injection Inject 0.25 mg into the skin every other day.        . levothyroxine (SYNTHROID, LEVOTHROID) 50 MCG tablet Take 1 tablet 30 minutes  before breakfast  90 tablet  3  . MICARDIS HCT 40-12.5 MG per tablet Take 1 tablet by mouth  every day  90 tablet  3   No current facility-administered medications on file prior to visit.    BP 142/100  Pulse 98  Temp(Src) 97.7 F (36.5 C) (Oral)  Resp 18  Wt 174 lb (78.926 kg)  BMI 24.97 kg/m2  SpO2 97%       Review of Systems  Constitutional: Negative for fever, chills, appetite  change and fatigue.  HENT: Positive for congestion, sore throat, rhinorrhea and sinus pressure. Negative for hearing loss, ear pain, trouble swallowing, neck stiffness, dental problem, voice change and tinnitus.   Eyes: Negative for pain, discharge and visual disturbance.  Respiratory: Negative for cough, chest tightness, wheezing and stridor.   Cardiovascular: Negative for chest pain, palpitations and leg swelling.  Gastrointestinal: Negative for nausea, vomiting, abdominal pain, diarrhea, constipation, blood in stool and abdominal distention.  Genitourinary: Negative for urgency, hematuria, flank pain, discharge, difficulty urinating and genital sores.  Musculoskeletal: Positive for arthralgias. Negative for myalgias, back pain, joint swelling and gait problem.  Skin: Negative for rash.  Neurological: Negative for dizziness, syncope, speech difficulty, weakness, numbness and headaches.  Hematological: Negative for adenopathy. Does not bruise/bleed easily.  Psychiatric/Behavioral: Negative for behavioral problems and dysphoric mood. The patient is not nervous/anxious.        Objective:   Physical Exam  Constitutional: He is oriented to person, place, and time. He appears well-developed.  HENT:  Head: Normocephalic.  Right Ear: External ear normal.  Left Ear: External ear normal.  Eyes: Conjunctivae and EOM are normal.  Neck: Normal range of motion.  Cardiovascular: Normal rate and normal heart sounds.   Pulmonary/Chest: Breath sounds normal.  Abdominal: Bowel sounds are normal.  Musculoskeletal: Normal range of motion. He exhibits no edema and no tenderness.  No tenderness to palpation involving the right shoulder area External rotation leg test positive   Neurological: He is alert and oriented to person, place, and time.  Psychiatric: He has a normal mood and affect. His behavior is normal.          Assessment & Plan:   URI  we'll treat symptomatically. Patient does have a  history of sinusitis in the past and is on immunomodulating therapy. We'll give a prescription for Augmentin if there is any clinical deterioration after one week Right shoulder pain- consistent with mild shoulder impingement syndrome. We'll treat anti-inflammatories and observe

## 2012-10-09 ENCOUNTER — Other Ambulatory Visit: Payer: Self-pay | Admitting: Internal Medicine

## 2012-12-19 ENCOUNTER — Other Ambulatory Visit: Payer: Self-pay | Admitting: Internal Medicine

## 2013-03-02 ENCOUNTER — Encounter: Payer: Self-pay | Admitting: Diagnostic Neuroimaging

## 2013-03-25 ENCOUNTER — Encounter (INDEPENDENT_AMBULATORY_CARE_PROVIDER_SITE_OTHER): Payer: Self-pay

## 2013-03-25 ENCOUNTER — Encounter: Payer: Self-pay | Admitting: Diagnostic Neuroimaging

## 2013-03-25 ENCOUNTER — Ambulatory Visit (INDEPENDENT_AMBULATORY_CARE_PROVIDER_SITE_OTHER): Payer: 59 | Admitting: Diagnostic Neuroimaging

## 2013-03-25 VITALS — BP 131/89 | HR 74 | Ht 70.0 in | Wt 170.0 lb

## 2013-03-25 DIAGNOSIS — G35 Multiple sclerosis: Secondary | ICD-10-CM

## 2013-03-25 NOTE — Patient Instructions (Signed)
I will check MRI and labs. 

## 2013-03-25 NOTE — Progress Notes (Signed)
[facility]  PATIENT: Adam Oliver DOB: July 24, 1959  REFERRING CLINICIAN:  HISTORY FROM: patient REASON FOR VISIT: follow up (transfer from Dr. Sandria Manly)   HISTORICAL  CHIEF COMPLAINT:  Chief Complaint  Patient presents with  . Follow-up    Dr. Sandria Manly transfer, MS    HISTORY OF PRESENT ILLNESS:   UPDATE 03/25/13: Since last visit, had reduced betaseron injections to 5-7 shots per per month. No MS flares. Still with intermittent numbness in hands and feet. Still with urinary voiding issues. Overall feels well.  PRIOR HPI (03/25/13, Dr. Sandria Manly): 57 year old left-handed white divorced  male with a history of multiple sclerosis beginning in February 1997 with urinary symptoms of bladder retention, numbness in the perineal region, pain in his hamstring radiating to the heel his right foot, numbness in his right foot, and Lhermitte's sign. Examination showed increased tone in the right leg versus the  left, decreased sensation S4 and S5 right and left, decreased left ankle jerk, decreased tone in the rectum, and absent bulbocavernosus reflex. MRI of the lumbar spine 08/10/95 showed evidence of abnormal signal present in the  conus medullaris which enhanced centrally along the peripheral surface without associated mass effect present. MRI of the brain with and without contrast 08/12/95 showed an active enhancing  multiple sclerosis plaque in the superior cerebellar vermis region just to the right of midline. There was a  focus of signal abnormality in the  anterior corpus callosum just to the  right of midline and large regions of smudging signal within the white matter of the parietal lobes, more marked on the left than the right.CBC, CMP, sedimentation rate, B12 level (238) ,ANA, and RPR were normal. He was seen by Dr. Connye Burkitt 11/28/95 for a second opinion. I do not have a copy of his report. He began Betaseron May 1997. In August 1997 he developed numbness in her hands  bilaterally. He has no significant side effects from his medication. He continues on the Betaseron but  takes it only 10 times per month. He does a crede maneuver to his bladder and sits down to void.  MRI of the brain with and  without contrast 08/12/09 showed bilateral pericallosal, juxtacortical, and subcortical white matter hyperintensities,  Most consistent with a diagnosis of multiple sclerosis. There were no enhancing lesions present.There is evidence of T1 black holes and corpus callosal atrophy. He enjoys golf and skiing. He does not run or play tennis. He enjoys a treadmill and weight machines. He denies fatigue. He denies visual disturbance, bowel incontinence, or sexual dysfunction. He occassionally  has bladder incontinence. He has numbness in his hands, right greater than left and in his feet. He denies memory loss or depression. Last blood studies 10/01/11 were normal basic metabolic panel and TSH.  REVIEW OF SYSTEMS: Full 14 system review of systems performed and notable only for numbness.  ALLERGIES: No Known Allergies  HOME MEDICATIONS: Outpatient Prescriptions Prior to Visit  Medication Sig Dispense Refill  . allopurinol (ZYLOPRIM) 300 MG tablet TAKE AS NEEDED AS DIRECTED  90 tablet  1  . atorvastatin (LIPITOR) 20 MG tablet Take 1 tablet every other  day  45 tablet  3  . levothyroxine (SYNTHROID, LEVOTHROID) 50 MCG tablet Take 1 tablet 30 minutes  before breakfast  90 tablet  3  . MICARDIS HCT 40-12.5 MG per tablet Take 1 tablet by mouth  every day  90 tablet  3  . telmisartan-hydrochlorothiazide (MICARDIS HCT) 40-12.5 MG per tablet Take 1 tablet  by mouth  every day  90 tablet  0  . interferon beta-1b (BETASERON) 0.3 MG injection Inject 0.25 mg into the skin every other day.        Marland Kitchen amoxicillin-clavulanate (AUGMENTIN) 875-125 MG per tablet Take 1 tablet by mouth 2 (two) times daily.  20 tablet  0  . cetirizine (ZYRTEC) 10 MG tablet Take 10 mg by mouth daily.       No  facility-administered medications prior to visit.    PAST MEDICAL HISTORY: Past Medical History  Diagnosis Date  . Multiple sclerosis   . Gout   . Hyperlipidemia   . Hypertension     PAST SURGICAL HISTORY: History reviewed. No pertinent past surgical history.  FAMILY HISTORY: Family History  Problem Relation Age of Onset  . Pancreatic cancer Mother     SOCIAL HISTORY:  History   Social History  . Marital Status: Single    Spouse Name: N/A    Number of Children: 2  . Years of Education: Law   Occupational History  . attorney    Social History Main Topics  . Smoking status: Never Smoker   . Smokeless tobacco: Never Used  . Alcohol Use: Yes     Comment: every other day  . Drug Use: No  . Sexual Activity: Not on file   Other Topics Concern  . Not on file   Social History Narrative   Patient lives at home alone.   Caffeine Use: 1 cup every other day     PHYSICAL EXAM  Filed Vitals:   03/25/13 0837  BP: 131/89  Pulse: 74  Height: 5\' 10"  (1.778 m)  Weight: 170 lb (77.111 kg)    Not recorded    Body mass index is 24.39 kg/(m^2).  GENERAL EXAM: Patient is in no distress  CARDIOVASCULAR: Regular rate and rhythm, no murmurs, no carotid bruits  NEUROLOGIC: MENTAL STATUS: awake, alert, language fluent, comprehension intact, naming intact CRANIAL NERVE: no papilledema on fundoscopic exam, pupils equal and reactive to light, visual fields full to confrontation, extraocular muscles intact, no nystagmus, facial sensation and strength symmetric, uvula midline, shoulder shrug symmetric, tongue midline. MOTOR: normal bulk and tone, full strength in the BUE, BLE SENSORY: normal and symmetric to light touch COORDINATION: finger-nose-finger, fine finger movements normal REFLEXES: BUE ABSENT, KNEES TRACE, ANKLES ABSENT. GAIT/STATION: narrow based gait; romberg is negative   DIAGNOSTIC DATA (LABS, IMAGING, TESTING) - I reviewed patient records, labs, notes,  testing and imaging myself where available.  Lab Results  Component Value Date   WBC 5.8 03/23/2011   HGB 15.0 03/23/2011   HCT 45.3 03/23/2011   MCV 93.1 03/23/2011   PLT 254.0 03/23/2011      Component Value Date/Time   NA 140 10/01/2011 0859   K 3.9 10/01/2011 0859   CL 104 10/01/2011 0859   CO2 27 10/01/2011 0859   GLUCOSE 96 10/01/2011 0859   GLUCOSE 110* 06/20/2006 1219   BUN 20 10/01/2011 0859   CREATININE 1.2 10/01/2011 0859   CALCIUM 9.1 10/01/2011 0859   PROT 7.6 03/23/2011 0927   ALBUMIN 4.4 03/23/2011 0927   AST 21 03/23/2011 0927   ALT 23 03/23/2011 0927   ALKPHOS 59 03/23/2011 0927   BILITOT 1.6* 03/23/2011 0927   GFRNONAA 66.62 08/24/2009 1026   GFRAA 81 09/16/2007 1208   Lab Results  Component Value Date   CHOL 166 03/23/2011   HDL 45.50 03/23/2011   LDLCALC 102* 03/23/2011   TRIG 92.0 03/23/2011  CHOLHDL 4 03/23/2011   Lab Results  Component Value Date   HGBA1C 6.0 06/24/2006   No results found for this basename: VITAMINB12   Lab Results  Component Value Date   TSH 0.97 10/01/2011    08/12/09 MRI brain - bilateral pericallosal, juxtacortical and subcortical white matter hyperintensities consistent with multiple sclerosis. No enhancing lesions are noted. The presence of T1 black holes and atrophy of corpus callosum indicates chronic disease.   ASSESSMENT AND PLAN  57 y.o. year old male here with multiple sclerosis. He is taking subtherapeutic dosing of betaseron. I recommended he switch to avonex or plegridy or newer oral agent. He is interested in plegridy (likely to be released in Nov 2014).  PLAN: - consider switching to avonex or plegridy for better compliance - check MRI and labs   Orders Placed This Encounter  Procedures  . MR Brain W Wo Contrast  . Vitamin B12  . Vitamin D, 25-hydroxy  . CBC With differential/Platelet  . Hepatic function panel    No orders of the defined types were placed in this encounter.    Return in about 1 year (around 03/25/2014)  for with Heide Guile or Taha Dimond.    Suanne Marker, MD 03/25/2013, 9:49 AM Certified in Neurology, Neurophysiology and Neuroimaging  [facility] [address] [place], [place] [address] [phone]

## 2013-03-26 ENCOUNTER — Other Ambulatory Visit: Payer: Self-pay | Admitting: Internal Medicine

## 2013-03-26 LAB — CBC WITH DIFFERENTIAL
Basos: 1 %
Hemoglobin: 13.9 g/dL (ref 12.6–17.7)
Immature Grans (Abs): 0 10*3/uL (ref 0.0–0.1)
Immature Granulocytes: 0 %
Lymphocytes Absolute: 1.4 10*3/uL (ref 0.7–3.1)
MCH: 29.9 pg (ref 26.6–33.0)
Monocytes: 11 %
Platelets: 262 10*3/uL (ref 150–379)
RBC: 4.65 x10E6/uL (ref 4.14–5.80)
RDW: 14 % (ref 12.3–15.4)

## 2013-03-26 LAB — HEPATIC FUNCTION PANEL
AST: 17 IU/L (ref 0–40)
Albumin: 4.6 g/dL (ref 3.5–5.5)
Bilirubin, Direct: 0.19 mg/dL (ref 0.00–0.40)
Total Protein: 7 g/dL (ref 6.0–8.5)

## 2013-04-01 ENCOUNTER — Telehealth: Payer: Self-pay

## 2013-04-01 NOTE — Telephone Encounter (Signed)
The forms needed to set this patient up will not be available until the first week of November.  I called the patient, got no answer.  Left message advising him he will need to complete the forms at that time.

## 2013-04-01 NOTE — Telephone Encounter (Signed)
Message copied by Malachy Moan on Wed Apr 01, 2013  2:59 PM ------      Message from: Joycelyn Schmid      Created: Wed Apr 01, 2013  1:57 PM       Please setup patient for Plegridy. I think it will be released in Nov 2014. Please let patient know. Thanks, VRP ------

## 2013-04-15 ENCOUNTER — Ambulatory Visit
Admission: RE | Admit: 2013-04-15 | Discharge: 2013-04-15 | Disposition: A | Discharge status: Home / Self Care | Payer: 59 | Source: Ambulatory Visit | Attending: Diagnostic Neuroimaging | Admitting: Diagnostic Neuroimaging

## 2013-04-15 DIAGNOSIS — G35 Multiple sclerosis: Secondary | ICD-10-CM

## 2013-04-15 MED ORDER — GADOBENATE DIMEGLUMINE 529 MG/ML IV SOLN
15.0000 mL | Freq: Once | INTRAVENOUS | Status: AC | PRN
  Administered 2013-04-15: 15 mL via INTRAVENOUS

## 2013-05-06 ENCOUNTER — Ambulatory Visit (INDEPENDENT_AMBULATORY_CARE_PROVIDER_SITE_OTHER): Payer: 59

## 2013-05-06 DIAGNOSIS — Z23 Encounter for immunization: Secondary | ICD-10-CM

## 2013-05-07 ENCOUNTER — Telehealth: Payer: Self-pay | Admitting: *Deleted

## 2013-05-07 NOTE — Telephone Encounter (Signed)
Pt called and was asking about results of MRI.  I called and gave him the results.  Also lab results.  He asked about the getting records to Dr. Lovell Sheehan.   I see he is able to see EPIC notes.  I did not fax.

## 2013-06-13 ENCOUNTER — Other Ambulatory Visit: Payer: Self-pay | Admitting: Internal Medicine

## 2013-06-22 ENCOUNTER — Other Ambulatory Visit: Payer: Self-pay | Admitting: Internal Medicine

## 2013-08-21 ENCOUNTER — Telehealth: Payer: Self-pay | Admitting: Internal Medicine

## 2013-08-21 MED ORDER — ALLOPURINOL 300 MG PO TABS: ORAL_TABLET | ORAL | Status: DC

## 2013-08-21 NOTE — Telephone Encounter (Signed)
rx sent in electronically 

## 2013-08-21 NOTE — Telephone Encounter (Signed)
Pt is needing a new rx allopurinol (ZYLOPRIM) 300 MG tablet, pt states he is out of town and needs sent to [phone]- address to pharmacy-[address], [place], [place]

## 2013-08-24 ENCOUNTER — Other Ambulatory Visit: Payer: Self-pay | Admitting: Internal Medicine

## 2013-09-14 ENCOUNTER — Other Ambulatory Visit (INDEPENDENT_AMBULATORY_CARE_PROVIDER_SITE_OTHER): Payer: 59

## 2013-09-14 DIAGNOSIS — Z0000 Encounter for general adult medical examination without abnormal findings: Secondary | ICD-10-CM

## 2013-09-14 LAB — HEPATIC FUNCTION PANEL
ALT: 23 U/L (ref 0–53)
AST: 21 U/L (ref 0–37)
Albumin: 4.4 g/dL (ref 3.5–5.2)
Alkaline Phosphatase: 57 U/L (ref 39–117)
BILIRUBIN DIRECT: 0.2 mg/dL (ref 0.0–0.3)
BILIRUBIN TOTAL: 1.7 mg/dL — AB (ref 0.3–1.2)
TOTAL PROTEIN: 7.3 g/dL (ref 6.0–8.3)

## 2013-09-14 LAB — BASIC METABOLIC PANEL
BUN: 34 mg/dL — ABNORMAL HIGH (ref 6–23)
CHLORIDE: 102 meq/L (ref 96–112)
CO2: 32 mEq/L (ref 19–32)
Calcium: 9.6 mg/dL (ref 8.4–10.5)
Creatinine, Ser: 1.5 mg/dL (ref 0.4–1.5)
GFR: 52.79 mL/min — ABNORMAL LOW (ref >60.00)
Glucose, Bld: 104 mg/dL — ABNORMAL HIGH (ref 70–99)
POTASSIUM: 5 meq/L (ref 3.5–5.1)
Sodium: 138 mEq/L (ref 135–145)

## 2013-09-14 LAB — POCT URINALYSIS DIPSTICK
BILIRUBIN UA: NEGATIVE
GLUCOSE UA: NEGATIVE
KETONES UA: NEGATIVE
Leukocytes, UA: NEGATIVE
Nitrite, UA: NEGATIVE
PH UA: 5
Protein, UA: NEGATIVE
RBC UA: NEGATIVE
SPEC GRAV UA: 1.02
Urobilinogen, UA: 0.2

## 2013-09-14 LAB — LIPID PANEL
CHOL/HDL RATIO: 5
Cholesterol: 208 mg/dL — ABNORMAL HIGH (ref 0–200)
HDL: 43.2 mg/dL (ref >39.00)
LDL CALC: 115 mg/dL — AB (ref 0–99)
TRIGLYCERIDES: 247 mg/dL — AB (ref 0.0–149.0)
VLDL: 49.4 mg/dL — AB (ref 0.0–40.0)

## 2013-09-14 LAB — TSH: TSH: 3.26 u[IU]/mL (ref 0.35–5.50)

## 2013-09-14 LAB — PSA: PSA: 1.6 ng/mL (ref 0.10–4.00)

## 2013-09-15 LAB — CBC WITH DIFFERENTIAL/PLATELET
BASOS PCT: 0.3 % (ref 0.0–3.0)
Basophils Absolute: 0 10*3/uL (ref 0.0–0.1)
EOS PCT: 7 % — AB (ref 0.0–5.0)
Eosinophils Absolute: 0.5 10*3/uL (ref 0.0–0.7)
HEMATOCRIT: 44.1 % (ref 39.0–52.0)
Hemoglobin: 14.7 g/dL (ref 13.0–17.0)
Lymphocytes Relative: 28.6 % (ref 12.0–46.0)
Lymphs Abs: 1.9 10*3/uL (ref 0.7–4.0)
MCHC: 33.3 g/dL (ref 30.0–36.0)
MCV: 90.5 fl (ref 78.0–100.0)
MONO ABS: 0.8 10*3/uL (ref 0.1–1.0)
Monocytes Relative: 11.7 % (ref 3.0–12.0)
NEUTROS PCT: 52.4 % (ref 43.0–77.0)
Neutro Abs: 3.5 10*3/uL (ref 1.4–7.7)
PLATELETS: 243 10*3/uL (ref 150.0–400.0)
RBC: 4.88 Mil/uL (ref 4.22–5.81)
RDW: 13.4 % (ref 11.5–14.6)
WBC: 6.7 10*3/uL (ref 4.5–10.5)

## 2013-09-21 ENCOUNTER — Encounter: Payer: Self-pay | Admitting: Internal Medicine

## 2013-09-21 ENCOUNTER — Ambulatory Visit (INDEPENDENT_AMBULATORY_CARE_PROVIDER_SITE_OTHER): Payer: 59 | Admitting: Internal Medicine

## 2013-09-21 VITALS — BP 120/90 | HR 84 | Temp 98.1°F | Ht 70.0 in | Wt 171.0 lb

## 2013-09-21 DIAGNOSIS — N401 Enlarged prostate with lower urinary tract symptoms: Secondary | ICD-10-CM

## 2013-09-21 DIAGNOSIS — N138 Other obstructive and reflux uropathy: Secondary | ICD-10-CM

## 2013-09-21 DIAGNOSIS — R351 Nocturia: Secondary | ICD-10-CM

## 2013-09-21 DIAGNOSIS — Z0000 Encounter for general adult medical examination without abnormal findings: Secondary | ICD-10-CM

## 2013-09-21 MED ORDER — ATORVASTATIN CALCIUM 20 MG PO TABS: 20.0000 mg | ORAL_TABLET | Freq: Every day | ORAL | Status: DC

## 2013-09-21 MED ORDER — TADALAFIL 5 MG PO TABS: 5.0000 mg | ORAL_TABLET | Freq: Every day | ORAL | Status: DC | PRN

## 2013-09-21 NOTE — Progress Notes (Signed)
   Subjective:    Patient ID: Adam Oliver, male    DOB: [birth year], 58 y.o.   MRN: [id number]  HPI  MS and has been on beta and had missed a does Has been offered a trial of the new MS injections  But insurance would not pay... But has been off the injections.  Has  Not had numbness and balance issues. The numbness has not changed off the treatment.    Review of Systems  Constitutional: Negative for fever and fatigue.  HENT: Negative for congestion, hearing loss and postnasal drip.   Eyes: Negative for discharge, redness and visual disturbance.  Respiratory: Negative for cough, shortness of breath and wheezing.   Cardiovascular: Negative for leg swelling.  Gastrointestinal: Negative for abdominal pain, constipation and abdominal distention.  Genitourinary: Negative for urgency and frequency.  Musculoskeletal: Negative for arthralgias, joint swelling and neck pain.  Skin: Negative for color change and rash.  Neurological: Negative for weakness and light-headedness.  Hematological: Negative for adenopathy.  Psychiatric/Behavioral: Negative for behavioral problems.       Objective:   Physical Exam  Nursing note reviewed. Constitutional: He is oriented to person, place, and time. He appears well-developed and well-nourished.  HENT:  Head: Normocephalic and atraumatic.  Eyes: Conjunctivae are normal. Pupils are equal, round, and reactive to light.  Neck: Normal range of motion. Neck supple.  Cardiovascular: Normal rate and regular rhythm.   Pulmonary/Chest: Effort normal and breath sounds normal.  Abdominal: Soft. Bowel sounds are normal.  Musculoskeletal: Normal range of motion.  Neurological: He is alert and oriented to person, place, and time. He displays abnormal reflex. No cranial nerve deficit. Coordination normal.  Skin: Skin is warm and dry.  Psychiatric: He has a normal mood and affect. His behavior is normal.          Assessment & Plan:  Patient presents for  yearly preventative medicine examination. ROS  questionnaire was completed  All immunizations and health maintenance protocols were reviewed with the patient and needed orders were placed.  Appropriate screening laboratory values were ordered for the patient including screening of hyperlipidemia, renal function and hepatic function. If indicated by BPH, a PSA was ordered.  Medication reconciliation,  past medical history, social history, problem list and allergies were reviewed in detail with the patient  Goals were established with regard to weight loss, exercise, and  diet in compliance with medications  End of life planning was discussed.  MS stable off treatment. Encouraged follow up with neurology and MRI  BPH with failure of flomax , successful rx with cialis ( saw urology) refill

## 2013-09-21 NOTE — Progress Notes (Signed)
Pre visit review using our clinic review tool, if applicable. No additional management support is needed unless otherwise documented below in the visit note. 

## 2013-09-21 NOTE — Patient Instructions (Addendum)
If you decide to remain off the injections then keep the neurology referral in October and would consider an MRI then as appropriate to see if you have a stable remission

## 2013-10-14 ENCOUNTER — Telehealth: Payer: Self-pay | Admitting: Internal Medicine

## 2013-10-14 NOTE — Telephone Encounter (Signed)
Pt states he needs prior auth for tadalafil (CIALIS) 5 MG tablet This med was rx by [facility], but dr Arnoldo Morale agreed to refill and send him a rx for this. Dr Arnoldo Morale sent to [facility] rx.  this was probably filled at [facility] the first time.

## 2013-10-15 NOTE — Telephone Encounter (Signed)
I have submitted to PA request.  It is currently under clinical review, [id number].

## 2013-10-19 NOTE — Telephone Encounter (Signed)
PA has been approved 10/15/13 - 10/16/14. Pt is aware.

## 2013-12-10 ENCOUNTER — Other Ambulatory Visit: Payer: Self-pay | Admitting: Internal Medicine

## 2014-01-12 ENCOUNTER — Other Ambulatory Visit: Payer: Self-pay | Admitting: Internal Medicine

## 2014-01-16 ENCOUNTER — Other Ambulatory Visit: Payer: Self-pay | Admitting: Internal Medicine

## 2014-03-25 ENCOUNTER — Ambulatory Visit: Payer: 59 | Admitting: Diagnostic Neuroimaging

## 2014-05-11 ENCOUNTER — Telehealth: Payer: Self-pay | Admitting: Internal Medicine

## 2014-05-11 MED ORDER — ALLOPURINOL 300 MG PO TABS: 300.0000 mg | ORAL_TABLET | Freq: Every day | ORAL | Status: DC

## 2014-05-11 NOTE — Telephone Encounter (Signed)
Pt request refill of the following: allopurinol (ZYLOPRIM) 300 MG tablet   Phamacy: [facility]

## 2014-05-11 NOTE — Telephone Encounter (Signed)
rx sent in electronically 

## 2014-07-14 ENCOUNTER — Other Ambulatory Visit: Payer: Self-pay

## 2014-07-14 MED ORDER — ALLOPURINOL 300 MG PO TABS: 300.0000 mg | ORAL_TABLET | Freq: Every day | ORAL | Status: DC

## 2014-07-14 NOTE — Telephone Encounter (Signed)
Rx request for Zyloprim 300 mg- Take 1 table by mouth daily #90  Pharm:  [facility]  Pls advise. Pt has upcoming establish appt on 2.2.2016

## 2014-07-20 ENCOUNTER — Encounter: Payer: Self-pay | Admitting: Family Medicine

## 2014-07-20 ENCOUNTER — Ambulatory Visit (INDEPENDENT_AMBULATORY_CARE_PROVIDER_SITE_OTHER): Payer: 59 | Admitting: Family Medicine

## 2014-07-20 VITALS — BP 128/88 | HR 68 | Temp 97.7°F | Ht 69.0 in | Wt 169.0 lb

## 2014-07-20 DIAGNOSIS — E785 Hyperlipidemia, unspecified: Secondary | ICD-10-CM

## 2014-07-20 DIAGNOSIS — E038 Other specified hypothyroidism: Secondary | ICD-10-CM

## 2014-07-20 DIAGNOSIS — Z23 Encounter for immunization: Secondary | ICD-10-CM

## 2014-07-20 DIAGNOSIS — R351 Nocturia: Secondary | ICD-10-CM

## 2014-07-20 DIAGNOSIS — G35 Multiple sclerosis: Secondary | ICD-10-CM

## 2014-07-20 DIAGNOSIS — I10 Essential (primary) hypertension: Secondary | ICD-10-CM

## 2014-07-20 LAB — BASIC METABOLIC PANEL
BUN: 22 mg/dL (ref 6–23)
CO2: 31 mEq/L (ref 19–32)
CREATININE: 1.29 mg/dL (ref 0.40–1.50)
Calcium: 9.6 mg/dL (ref 8.4–10.5)
Chloride: 103 mEq/L (ref 96–112)
GFR: 60.25 mL/min (ref >60.00)
GLUCOSE: 109 mg/dL — AB (ref 70–99)
Potassium: 4.7 mEq/L (ref 3.5–5.1)
Sodium: 140 mEq/L (ref 135–145)

## 2014-07-20 LAB — LIPID PANEL
CHOL/HDL RATIO: 4
CHOLESTEROL: 173 mg/dL (ref 0–200)
HDL: 48 mg/dL (ref >39.00)
LDL CALC: 86 mg/dL (ref 0–99)
NONHDL: 125
Triglycerides: 193 mg/dL — ABNORMAL HIGH (ref 0.0–149.0)
VLDL: 38.6 mg/dL (ref 0.0–40.0)

## 2014-07-20 LAB — TSH: TSH: 1.46 u[IU]/mL (ref 0.35–4.50)

## 2014-07-20 NOTE — Patient Instructions (Signed)
BEFORE YOU LEAVE: -labs -flu vaccine  -We have ordered labs or studies at this visit. It can take up to 1-2 weeks for results and processing. We will contact you with instructions IF your results are abnormal. Normal results will be released to your [facility]. If you have not heard from [place] or can not find your results in [facility] in 2 weeks please contact our office.  -PLEASE SIGN UP FOR MYCHART TODAY   We recommend the following healthy lifestyle measures: - eat a healthy diet consisting of lots of vegetables, fruits, beans, nuts, seeds, healthy meats such as white chicken and fish and whole grains.  - avoid fried foods, fast food, processed foods, sodas, red meet and other fattening foods.  - get a least 150 minutes of aerobic exercise per week.

## 2014-07-20 NOTE — Progress Notes (Signed)
Pre visit review using our clinic review tool, if applicable. No additional management support is needed unless otherwise documented below in the visit note. 

## 2014-07-20 NOTE — Progress Notes (Signed)
HPI:  Adam Oliver is here to establish care.  Last PCP and physical: doing well  Has the following chronic problems that require follow up and concerns today:   MS: -managed by his neurologist -dx in 1997 -on Betaseron previously - feels just as well off of this as on - reports Dr. Adline Mango told him the lipitor can treat this -has seen urologist for his urological and sexual dysfunction, reports takes cialis for nocturia occassionally -denies: memory issues, depression, visual loss, weakness, numbness hands and feet unchanged  HTN: -meds: telmisartan-hctz Denies: CP, SOb, DOE -exercises a few days per week when he can  Hyperlipidemia: -meds: atorvastatin -stable  Hypothyroidism: -meds: synthroid 28mcg - on this dose for a long time -denies: palpitations, hot/cold intol, constipation  GOUT: -hx of -occ takes allopurinol for flare?  BPH/Nocturia: -s/p eval with urologist -takes cialis for this intermittently  ROS negative for unless reported above: fevers, unintentional weight loss, hearing or vision loss, chest pain, palpitations, struggling to breath, hemoptysis, melena, hematochezia, hematuria, falls, loc, si, thoughts of self harm  Past Medical History  Diagnosis Date  . Multiple sclerosis   . Gout   . Hyperlipidemia   . Hypertension     No past surgical history on file.  Family History  Problem Relation Age of Onset  . Pancreatic cancer Mother     History   Social History  . Marital Status: Single    Spouse Name: N/A    Number of Children: 2  . Years of Education: Law   Occupational History  . attorney    Social History Main Topics  . Smoking status: Never Smoker   . Smokeless tobacco: Never Used  . Alcohol Use: Yes     Comment: every other day - 2 drinks  . Drug Use: No  . Sexual Activity: None   Other Topics Concern  . None   Social History Narrative   Patient lives at home alone. Dating.   Caffeine Use: 1 cup every other day    [place]   No regular exercise, diet is so so   [employer].     Current outpatient prescriptions:  .  atorvastatin (LIPITOR) 20 MG tablet, Take 1 tablet (20 mg total) by mouth daily at 6 PM., Disp: 90 tablet, Rfl: 3 .  levothyroxine (SYNTHROID, LEVOTHROID) 50 MCG tablet, Take 1 tablet by mouth 30   minutes  before breakfast, Disp: 90 tablet, Rfl: 3 .  tadalafil (CIALIS) 5 MG tablet, Take 1 tablet (5 mg total) by mouth daily as needed for erectile dysfunction., Disp: 90 tablet, Rfl: 3 .  telmisartan-hydrochlorothiazide (MICARDIS HCT) 40-12.5 MG per tablet, Take 1 tablet by mouth  every day, Disp: 90 tablet, Rfl: 2  EXAM:  Filed Vitals:   07/20/14 1126  BP: 128/88  Pulse: 68  Temp: 97.7 F (36.5 C)    Body mass index is 24.95 kg/(m^2).  GENERAL: vitals reviewed and listed above, alert, oriented, appears well hydrated and in no acute distress  HEENT: atraumatic, conjunttiva clear, no obvious abnormalities on inspection of external nose and ears  NECK: no obvious masses on inspection  LUNGS: clear to auscultation bilaterally, no wheezes, rales or rhonchi, good air movement  CV: HRRR, no peripheral edema  MS: moves all extremities without noticeable abnormality  PSYCH: pleasant and cooperative, no obvious depression or anxiety  ASSESSMENT AND PLAN:  Discussed the following assessment and plan:  Other specified hypothyroidism - Plan: TSH -cont synthroid, adjust as needed  Hyperlipemia - Plan: Lipid Panel -lifestyle recs  Multiple sclerosis -interesting hx and course, well controlled off medications at this time, advised follow up with his neurologist for monitoring  Essential hypertension - Plan: Basic metabolic panel -stable  Nocturia: Takes cialis for this - reports rxd by urologist  -We reviewed the PMH, PSH, FH, SH, Meds and Allergies. -We provided refills for any medications we will prescribe as needed. -We addressed current concerns per orders  and patient instructions. -We have asked for records for pertinent exams, studies, vaccines and notes from previous providers. -We have advised patient to follow up per instructions below.   -Patient advised to return or notify a doctor immediately if symptoms worsen or persist or new concerns arise.  Patient Instructions  BEFORE YOU LEAVE: -labs -flu vaccine  -We have ordered labs or studies at this visit. It can take up to 1-2 weeks for results and processing. We will contact you with instructions IF your results are abnormal. Normal results will be released to your [facility]. If you have not heard from [place] or can not find your results in [facility] in 2 weeks please contact our office.  -PLEASE SIGN UP FOR MYCHART TODAY   We recommend the following healthy lifestyle measures: - eat a healthy diet consisting of lots of vegetables, fruits, beans, nuts, seeds, healthy meats such as white chicken and fish and whole grains.  - avoid fried foods, fast food, processed foods, sodas, red meet and other fattening foods.  - get a least 150 minutes of aerobic exercise per week.       Colin Benton R.

## 2014-07-21 ENCOUNTER — Telehealth: Payer: Self-pay | Admitting: Family Medicine

## 2014-07-21 NOTE — Telephone Encounter (Signed)
emmi emailed °

## 2014-08-24 ENCOUNTER — Other Ambulatory Visit: Payer: Self-pay | Admitting: *Deleted

## 2014-08-24 MED ORDER — ATORVASTATIN CALCIUM 20 MG PO TABS: 20.0000 mg | ORAL_TABLET | Freq: Every day | ORAL | Status: DC

## 2014-09-20 ENCOUNTER — Other Ambulatory Visit: Payer: Self-pay | Admitting: Family Medicine

## 2014-09-21 NOTE — Telephone Encounter (Signed)
Patient informed of the message below and states he does not need the refill as this was sent through as an automatic refill.

## 2014-09-21 NOTE — Telephone Encounter (Signed)
I think he told me he was taking this medication as needed for gout flare. This medication is not used to treat gout flares. I advise aleve for gout flares twice daily for 3-7 days and office visit if this is not working. If having several flares per year, could start allopurinol on a daily prophylactic basis 1 month after flare to decreased recurrence.

## 2014-09-21 NOTE — Telephone Encounter (Signed)
Left a message for the pt to return my call.  

## 2014-09-27 ENCOUNTER — Telehealth: Payer: Self-pay

## 2014-09-27 MED ORDER — TELMISARTAN-HCTZ 40-12.5 MG PO TABS: ORAL_TABLET | ORAL | Status: DC

## 2014-09-27 NOTE — Telephone Encounter (Signed)
[facility] refill request for telmisartan-hydrochlorothiazide (MICARDIS HCT) 40-12.5 MG per tablet for #90 with refills

## 2014-12-21 ENCOUNTER — Other Ambulatory Visit: Payer: Self-pay | Admitting: *Deleted

## 2014-12-21 MED ORDER — LEVOTHYROXINE SODIUM 50 MCG PO TABS: ORAL_TABLET | ORAL | Status: DC

## 2015-02-20 ENCOUNTER — Other Ambulatory Visit: Payer: Self-pay | Admitting: Family Medicine

## 2015-05-16 ENCOUNTER — Telehealth: Payer: Self-pay | Admitting: Family Medicine

## 2015-05-16 ENCOUNTER — Ambulatory Visit (INDEPENDENT_AMBULATORY_CARE_PROVIDER_SITE_OTHER): Payer: 59 | Admitting: Family Medicine

## 2015-05-16 ENCOUNTER — Telehealth: Payer: Self-pay | Admitting: *Deleted

## 2015-05-16 ENCOUNTER — Encounter: Payer: Self-pay | Admitting: Family Medicine

## 2015-05-16 VITALS — BP 130/84 | HR 75 | Temp 98.2°F | Ht 70.0 in | Wt 175.8 lb

## 2015-05-16 DIAGNOSIS — Z23 Encounter for immunization: Secondary | ICD-10-CM

## 2015-05-16 DIAGNOSIS — R05 Cough: Secondary | ICD-10-CM | POA: Diagnosis not present

## 2015-05-16 DIAGNOSIS — R059 Cough, unspecified: Secondary | ICD-10-CM

## 2015-05-16 MED ORDER — PREDNISONE 10 MG PO TABS: ORAL_TABLET | ORAL | Status: DC

## 2015-05-16 NOTE — Patient Instructions (Signed)
BEFORE YOU LEAVE: -do you want your flu shot? -schedule yearly physical exam -xray sheet  Take the prednisone  Get the xray  Follow up if cough persists, worsens or other concerns

## 2015-05-16 NOTE — Progress Notes (Signed)
Pre visit review using our clinic review tool, if applicable. No additional management support is needed unless otherwise documented below in the visit note. 

## 2015-05-16 NOTE — Telephone Encounter (Signed)
Rx resent to [facility]

## 2015-05-16 NOTE — Telephone Encounter (Signed)
He needs predniSONE (DELTASONE) 10 MG tablet sent to [id number] - [place], [address] AT [address] [place] instead of mail order

## 2015-05-16 NOTE — Progress Notes (Signed)
HPI:  Walk in for:  Cough: -started: 2 weeks ago -symptoms:cough, drainage, wheezy a little -denies:fever, SOB, NVD, tooth pain, sinus pain -has tried: OTC cough medications -sick contacts/travel/risks: denies flu exposure, tick exposure or or Ebola risks  ROS: See pertinent positives and negatives per HPI.  Past Medical History  Diagnosis Date  . Multiple sclerosis ([place])   . Gout   . Hyperlipidemia   . Hypertension     No past surgical history on file.  Family History  Problem Relation Age of Onset  . Pancreatic cancer Mother     Social History   Social History  . Marital Status: Single    Spouse Name: N/A  . Number of Children: 2  . Years of Education: Law   Occupational History  . attorney    Social History Main Topics  . Smoking status: Never Smoker   . Smokeless tobacco: Never Used  . Alcohol Use: Yes     Comment: every other day - 2 drinks  . Drug Use: No  . Sexual Activity: Not Asked   Other Topics Concern  . None   Social History Narrative   Patient lives at home alone. Dating.   Caffeine Use: 1 cup every other day   Christian   No regular exercise, diet is so so   [employer].     Current outpatient prescriptions:  .  atorvastatin (LIPITOR) 20 MG tablet, Take 1 tablet by mouth  daily at 6pm., Disp: 90 tablet, Rfl: 1 .  levothyroxine (SYNTHROID, LEVOTHROID) 50 MCG tablet, Take 1 tablet by mouth 30   minutes  before breakfast, Disp: 90 tablet, Rfl: 1 .  predniSONE (DELTASONE) 10 MG tablet, 40mg  (4 tablets) on day one, then 30mg (3 tabs) for 2 days, then 20mg  (2 tabs) for 2 days, then 10mg  (1 tab) for 2 days., Disp: 16 tablet, Rfl: 0 .  telmisartan-hydrochlorothiazide (MICARDIS HCT) 40-12.5 MG per tablet, Take 1 tablet by mouth  every day, Disp: 90 tablet, Rfl: 3  EXAM:  Filed Vitals:   05/16/15 1522  BP: 130/84  Pulse: 75  Temp: 98.2 F (36.8 C)    Body mass index is 25.22 kg/(m^2).  GENERAL: vitals reviewed and listed  above, alert, oriented, appears well hydrated and in no acute distress  HEENT: atraumatic, conjunttiva clear, no obvious abnormalities on inspection of external nose and ears, normal appearance of ear canals and TMs, clear nasal congestion, mild post oropharyngeal erythema with PND, no tonsillar edema or exudate, no sinus TTP  NECK: no obvious masses on inspection  LUNGS: clear to auscultation bilaterally, no wheezes, rales or rhonchi, good air movement  CV: HRRR, no peripheral edema  MS: moves all extremities without noticeable abnormality  PSYCH: pleasant and cooperative, no obvious depression or anxiety  ASSESSMENT AND PLAN:  Discussed the following assessment and plan:  Cough - Plan: DG Chest 2 View  -given HPI and exam findings today, a serious infection or illness is unlikely. We discussed potential etiologies, with VURI and bronchitis being most likely, and opted for short course prednisone, CXR to r/o CAP and follow up as needed. He declined a cough medication. We discussed treatment side effects, likely course, antibiotic misuse, transmission, and signs of developing a serious illness. -of course, we advised to return or notify a doctor immediately if symptoms worsen or persist or new concerns arise.    Patient Instructions  BEFORE YOU LEAVE: -do you want your flu shot? -schedule yearly physical exam -xray sheet  Take  the prednisone  Get the xray  Follow up if cough persists, worsens or other concerns     Payam Gribble R.

## 2015-05-16 NOTE — Telephone Encounter (Signed)
Patient walked into office with complaints of cough x 2 weeks. Patient states beginning of two weeks he had cold-like symptoms, which subsided minus the cough. Vital signs during triage: BP 130/84 HR 75 Temp 98.2 O2 95% RA. Patient has tried OTC but did not provide much relief. Spoke with Dr. Maudie Mercury and she requested to double-book her 3:00pm spot and see patient now. Patient now in exam room.

## 2015-05-31 ENCOUNTER — Encounter: Payer: Self-pay | Admitting: Family Medicine

## 2015-05-31 ENCOUNTER — Ambulatory Visit (INDEPENDENT_AMBULATORY_CARE_PROVIDER_SITE_OTHER): Payer: 59 | Admitting: Family Medicine

## 2015-05-31 VITALS — BP 120/80 | HR 90 | Temp 97.5°F | Ht 68.5 in | Wt 168.3 lb

## 2015-05-31 DIAGNOSIS — Z0000 Encounter for general adult medical examination without abnormal findings: Secondary | ICD-10-CM | POA: Diagnosis not present

## 2015-05-31 DIAGNOSIS — E038 Other specified hypothyroidism: Secondary | ICD-10-CM | POA: Diagnosis not present

## 2015-05-31 DIAGNOSIS — Z1211 Encounter for screening for malignant neoplasm of colon: Secondary | ICD-10-CM

## 2015-05-31 DIAGNOSIS — I10 Essential (primary) hypertension: Secondary | ICD-10-CM | POA: Diagnosis not present

## 2015-05-31 DIAGNOSIS — Z23 Encounter for immunization: Secondary | ICD-10-CM

## 2015-05-31 DIAGNOSIS — E785 Hyperlipidemia, unspecified: Secondary | ICD-10-CM | POA: Diagnosis not present

## 2015-05-31 NOTE — Progress Notes (Signed)
Pre visit review using our clinic review tool, if applicable. No additional management support is needed unless otherwise documented below in the visit note. 

## 2015-05-31 NOTE — Progress Notes (Signed)
HPI:  Here for CPE:  -Concerns and/or follow up today:   MS: -managed by his neurologist - reports has had no symptoms in a long time -dx in 1997 -on Betaseron previously - feels just as well off of this as on - reports Dr. Adline Mango told him the lipitor can treat this -has seen urologist for his urological and sexual dysfunction, reports takes cialis for nocturia occassionally -denies: memory issues, depression, visual loss, weakness, numbness hands and feet unchanged  HTN: -meds: telmisartan-hctz Denies: CP, SOb, DOE -exercises a few days per week when he can  Hyperlipidemia: -meds: atorvastatin -stable  Hypothyroidism: -meds: synthroid 20mcg - on this dose for a long time -denies: palpitations, hot/cold intol, constipation  GOUT: -hx of -occ takes allopurinol for flare?  BPH/Nocturia: -s/p eval with urologist, no change in symptoms, stable for many years -takes cialis for this intermittently  -Diet: variety of foods, balance and well rounded  -Exercise: some regular exercise  -Diabetes and Dyslipidemia Screening: FASTING today  -Hx of HTN: no  -Vaccines: wants shingles vaccine  -wants STI testing, Hep C screening (if born 60-1967): wants hep c test  -FH colon or prstate ca: see FH Last colon cancer screening: reports done at age 38 with recs to repeat in 10 years Last prostate ca screening: did yearly with prior PCP -discussed risks and benefits and he wants to continue screening yearly  -Alcohol, Tobacco, drug use: see social history  Review of Systems - no fevers, unintentional weight loss, vision loss, hearing loss, chest pain, sob, hemoptysis, melena, hematochezia, hematuria, genital discharge, changing or concerning skin lesions, bleeding, bruising, loc, thoughts of self harm or SI  Past Medical History  Diagnosis Date  . Multiple sclerosis ([place])   . Gout   . Hyperlipidemia   . Hypertension     No past surgical history on file.  Family  History  Problem Relation Age of Onset  . Pancreatic cancer Mother     Social History   Social History  . Marital Status: Single    Spouse Name: N/A  . Number of Children: 2  . Years of Education: Law   Occupational History  . attorney    Social History Main Topics  . Smoking status: Never Smoker   . Smokeless tobacco: Never Used  . Alcohol Use: Yes     Comment: every other day - 2 drinks  . Drug Use: No  . Sexual Activity: Not Asked   Other Topics Concern  . None   Social History Narrative   Patient lives at home alone. Dating.   Caffeine Use: 1 cup every other day   Christian   No regular exercise, diet is so so   [employer].     Current outpatient prescriptions:  .  atorvastatin (LIPITOR) 20 MG tablet, Take 1 tablet by mouth  daily at 6pm., Disp: 90 tablet, Rfl: 1 .  levothyroxine (SYNTHROID, LEVOTHROID) 50 MCG tablet, Take 1 tablet by mouth 30   minutes  before breakfast, Disp: 90 tablet, Rfl: 1 .  telmisartan-hydrochlorothiazide (MICARDIS HCT) 40-12.5 MG per tablet, Take 1 tablet by mouth  every day, Disp: 90 tablet, Rfl: 3  EXAM:  Filed Vitals:   05/31/15 1512  BP: 120/80  Pulse: 90  Temp: 97.5 F (36.4 C)  TempSrc: Oral  Height: 5' 8.5" (1.74 m)  Weight: 168 lb 4.8 oz (76.34 kg)    Estimated body mass index is 25.21 kg/(m^2) as calculated from the following:   Height as of  this encounter: 5' 8.5" (1.74 m).   Weight as of this encounter: 168 lb 4.8 oz (76.34 kg).  GENERAL: vitals reviewed and listed below, alert, oriented, appears well hydrated and in no acute distress  HEENT: head atraumatic, PERRLA, normal appearance of eyes, ears, nose and mouth. moist mucus membranes.  NECK: supple, no masses or lymphadenopathy  LUNGS: clear to auscultation bilaterally, no rales, rhonchi or wheeze  CV: HRRR, no peripheral edema or cyanosis, normal pedal pulses  ABDOMEN: bowel sounds normal, soft, non tender to palpation, no masses, no rebound or  guarding  GU: declined  SKIN: no rash or abnormal lesions  MS: normal gait, moves all extremities normally  NEURO: CN II-XII grossly intact, normal muscle strength and sensation to light touch on extremities  PSYCH: normal affect, pleasant and cooperative  ASSESSMENT AND PLAN:  Discussed the following assessment and plan:  .Visit for preventive health examination - Plan: PSA, Hep C Antibody  Other specified hypothyroidism - Plan: TSH  Hyperlipemia - Plan: Lipid panel  Essential hypertension - Plan: Basic metabolic panel  Colon cancer screening - Plan: Ambulatory referral to Gastroenterology   -Discussed and advised all [place] preventive services health task force level A and B recommendations for age, sex and risks.  -Advised at least 150 minutes of exercise per week and a healthy diet low in saturated fats and sweets and consisting of fresh fruits and vegetables, lean meats such as fish and white chicken and whole grains.  -FASTING labs, studies and vaccines per orders this encounter  -discuss risks/benefits of shingles vaccine which he requested and reviewed national [place] society guidelines regarding this vaccine in [place], he opted to proceed as had chicken pox as child and one case of shingles remotely.   Patient advised to return to clinic immediately if symptoms worsen or persist or new concerns.  Patient Instructions  BEFORE YOU LEAVE: -labs -shingles vaccine -follow up in 6 months  -We have ordered labs or studies at this visit. It can take up to 1-2 weeks for results and processing. We will contact you with instructions IF your results are abnormal. Normal results will be released to your [facility]. If you have not heard from [place] or can not find your results in [facility] in 2 weeks please contact our office.  We recommend the following healthy lifestyle measures: - eat a healthy whole foods diet consisting of regular small meals composed of vegetables, fruits, beans, nuts,  seeds, healthy meats such as white chicken and fish and whole grains.  - avoid sweets, white starchy foods, fried foods, fast food, processed foods, sodas, red meet and other fattening foods.  - get a least 150-300 minutes of aerobic exercise per week.           No Follow-up on file.   Colin Benton R.

## 2015-05-31 NOTE — Addendum Note (Signed)
Addended by: Agnes Lawrence on: 05/31/2015 04:25 PM   Modules accepted: Orders

## 2015-05-31 NOTE — Patient Instructions (Addendum)
BEFORE YOU LEAVE: -labs -shingles vaccine -follow up in 6 months  -We have ordered labs or studies at this visit. It can take up to 1-2 weeks for results and processing. We will contact you with instructions IF your results are abnormal. Normal results will be released to your [facility]. If you have not heard from [place] or can not find your results in [facility] in 2 weeks please contact our office.  We recommend the following healthy lifestyle measures: - eat a healthy whole foods diet consisting of regular small meals composed of vegetables, fruits, beans, nuts, seeds, healthy meats such as white chicken and fish and whole grains.  - avoid sweets, white starchy foods, fried foods, fast food, processed foods, sodas, red meet and other fattening foods.  - get a least 150-300 minutes of aerobic exercise per week.   -We placed a referral for you as discussed for the colonoscopy. It usually takes about 1-2 weeks to process and schedule this referral. If you have not heard from [place] regarding this appointment in 2 weeks please contact our office.

## 2015-06-01 LAB — BASIC METABOLIC PANEL
BUN: 30 mg/dL — ABNORMAL HIGH (ref 6–23)
CO2: 28 mEq/L (ref 19–32)
CREATININE: 1.4 mg/dL (ref 0.40–1.50)
Calcium: 9.4 mg/dL (ref 8.4–10.5)
Chloride: 103 mEq/L (ref 96–112)
GFR: 54.66 mL/min — AB (ref >60.00)
Glucose, Bld: 99 mg/dL (ref 70–99)
Potassium: 3.8 mEq/L (ref 3.5–5.1)
SODIUM: 143 meq/L (ref 135–145)

## 2015-06-01 LAB — LIPID PANEL
CHOL/HDL RATIO: 4
CHOLESTEROL: 205 mg/dL — AB (ref 0–200)
HDL: 52.4 mg/dL (ref >39.00)
LDL Cholesterol: 123 mg/dL — ABNORMAL HIGH (ref 0–99)
NonHDL: 152.15
TRIGLYCERIDES: 147 mg/dL (ref 0.0–149.0)
VLDL: 29.4 mg/dL (ref 0.0–40.0)

## 2015-06-01 LAB — TSH: TSH: 1.02 u[IU]/mL (ref 0.35–4.50)

## 2015-06-01 LAB — PSA: PSA: 1.52 ng/mL (ref 0.10–4.00)

## 2015-06-01 LAB — HEPATITIS C ANTIBODY: HCV Ab: NEGATIVE

## 2015-06-18 ENCOUNTER — Other Ambulatory Visit: Payer: Self-pay | Admitting: Family Medicine

## 2015-08-19 ENCOUNTER — Other Ambulatory Visit: Payer: Self-pay | Admitting: Family Medicine

## 2015-10-12 ENCOUNTER — Other Ambulatory Visit: Payer: Self-pay | Admitting: Family Medicine

## 2015-11-29 ENCOUNTER — Ambulatory Visit: Payer: 59 | Admitting: Family Medicine

## 2015-12-26 ENCOUNTER — Ambulatory Visit (INDEPENDENT_AMBULATORY_CARE_PROVIDER_SITE_OTHER): Payer: 59 | Admitting: Family Medicine

## 2015-12-26 DIAGNOSIS — R69 Illness, unspecified: Secondary | ICD-10-CM

## 2015-12-26 NOTE — Progress Notes (Signed)
NO SHOW

## 2016-01-02 ENCOUNTER — Encounter: Payer: Self-pay | Admitting: Family Medicine

## 2016-01-02 ENCOUNTER — Ambulatory Visit (INDEPENDENT_AMBULATORY_CARE_PROVIDER_SITE_OTHER): Payer: 59 | Admitting: Family Medicine

## 2016-01-02 VITALS — BP 100/70 | HR 82 | Temp 97.5°F | Ht 68.5 in | Wt 168.8 lb

## 2016-01-02 DIAGNOSIS — R944 Abnormal results of kidney function studies: Secondary | ICD-10-CM | POA: Diagnosis not present

## 2016-01-02 DIAGNOSIS — E785 Hyperlipidemia, unspecified: Secondary | ICD-10-CM | POA: Diagnosis not present

## 2016-01-02 DIAGNOSIS — I10 Essential (primary) hypertension: Secondary | ICD-10-CM

## 2016-01-02 DIAGNOSIS — E038 Other specified hypothyroidism: Secondary | ICD-10-CM

## 2016-01-02 LAB — LIPID PANEL
CHOL/HDL RATIO: 5
CHOLESTEROL: 175 mg/dL (ref 0–200)
HDL: 38.8 mg/dL — ABNORMAL LOW (ref >39.00)
LDL CALC: 113 mg/dL — AB (ref 0–99)
NONHDL: 136.43
Triglycerides: 115 mg/dL (ref 0.0–149.0)
VLDL: 23 mg/dL (ref 0.0–40.0)

## 2016-01-02 LAB — BASIC METABOLIC PANEL
BUN: 40 mg/dL — ABNORMAL HIGH (ref 6–23)
CO2: 29 mEq/L (ref 19–32)
Calcium: 9.5 mg/dL (ref 8.4–10.5)
Chloride: 103 mEq/L (ref 96–112)
Creatinine, Ser: 1.95 mg/dL — ABNORMAL HIGH (ref 0.40–1.50)
GFR: 37.22 mL/min — ABNORMAL LOW (ref >60.00)
Glucose, Bld: 99 mg/dL (ref 70–99)
POTASSIUM: 5.2 meq/L — AB (ref 3.5–5.1)
SODIUM: 140 meq/L (ref 135–145)

## 2016-01-02 NOTE — Progress Notes (Signed)
  HPI:  Adam Oliver is a very pleasant 60 yo here for follow up. He reports he feels great and is without any complaints. He takes synthroid for his hyporthyroidism, his BP and cholesterol medications daily. He reports regular exercise and a healthy diet. He requests follow up 1 yearly moving forward as feels health is stable. Declines AVS.  ROS: See pertinent positives and negatives per HPI.  Past Medical History  Diagnosis Date  . Multiple sclerosis ([place])   . Gout   . Hyperlipidemia   . Hypertension   . Hypothyroidism     No past surgical history on file.  Family History  Problem Relation Age of Onset  . Pancreatic cancer Mother     Social History   Social History  . Marital Status: Single    Spouse Name: N/A  . Number of Children: 2  . Years of Education: Law   Occupational History  . attorney    Social History Main Topics  . Smoking status: Never Smoker   . Smokeless tobacco: Never Used  . Alcohol Use: Yes     Comment: every other day - 2 drinks  . Drug Use: No  . Sexual Activity: Not Asked   Other Topics Concern  . None   Social History Narrative   Patient lives at home alone. Dating.   Caffeine Use: 1 cup every other day   Christian   No regular exercise, diet is so so   [employer].     Current outpatient prescriptions:  .  atorvastatin (LIPITOR) 20 MG tablet, Take 1 tablet by mouth  daily at 6pm., Disp: 90 tablet, Rfl: 1 .  levothyroxine (SYNTHROID, LEVOTHROID) 50 MCG tablet, Take 1 tablet by mouth 30  minutes before breakfast, Disp: 90 tablet, Rfl: 3 .  telmisartan-hydrochlorothiazide (MICARDIS HCT) 40-12.5 MG tablet, Take 1 tablet by mouth  every day, Disp: 90 tablet, Rfl: 3  EXAM:  Filed Vitals:   01/02/16 1007  BP: 100/70  Pulse: 82  Temp: 97.5 F (36.4 C)    Body mass index is 25.29 kg/(m^2).  GENERAL: vitals reviewed and listed above, alert, oriented, appears well hydrated and in no acute distress  HEENT: atraumatic,  conjunttiva clear, no obvious abnormalities on inspection of external nose and ears  NECK: no obvious masses on inspection  LUNGS: clear to auscultation bilaterally, no wheezes, rales or rhonchi, good air movement  CV: HRRR, no peripheral edema  MS: moves all extremities without noticeable abnormality  PSYCH: pleasant and cooperative, no obvious depression or anxiety  ASSESSMENT AND PLAN:  Discussed the following assessment and plan:  Other specified hypothyroidism  Hyperlipemia - Plan: Lipid Panel  Essential hypertension - Plan: Basic metabolic panel, CBC (no diff)  -lifestyle recs -fasting labs today -CPE in 1 year -Patient advised to return or notify a doctor immediately if symptoms worsen or persist or new concerns arise.  There are no Patient Instructions on file for this visit.  Colin Benton R., DO

## 2016-01-02 NOTE — Progress Notes (Signed)
Pre visit review using our clinic review tool, if applicable. No additional management support is needed unless otherwise documented below in the visit note. 

## 2016-01-03 LAB — CBC
HCT: 43.7 % (ref 39.0–52.0)
HEMOGLOBIN: 14.7 g/dL (ref 13.0–17.0)
MCHC: 33.6 g/dL (ref 30.0–36.0)
MCV: 88 fl (ref 78.0–100.0)
Platelets: 264 10*3/uL (ref 150.0–400.0)
RBC: 4.97 Mil/uL (ref 4.22–5.81)
RDW: 14.1 % (ref 11.5–15.5)
WBC: 7.8 10*3/uL (ref 4.0–10.5)

## 2016-01-16 NOTE — Addendum Note (Signed)
Addended by: Agnes Lawrence on: 01/16/2016 04:17 PM   Modules accepted: Orders

## 2016-02-06 ENCOUNTER — Telehealth: Payer: Self-pay | Admitting: *Deleted

## 2016-02-06 ENCOUNTER — Other Ambulatory Visit (INDEPENDENT_AMBULATORY_CARE_PROVIDER_SITE_OTHER): Payer: 59

## 2016-02-06 DIAGNOSIS — R944 Abnormal results of kidney function studies: Secondary | ICD-10-CM | POA: Diagnosis not present

## 2016-02-06 LAB — BASIC METABOLIC PANEL
BUN: 26 mg/dL — ABNORMAL HIGH (ref 6–23)
CO2: 27 mEq/L (ref 19–32)
CREATININE: 1.48 mg/dL (ref 0.40–1.50)
Calcium: 9 mg/dL (ref 8.4–10.5)
Chloride: 105 mEq/L (ref 96–112)
GFR: 51.15 mL/min — ABNORMAL LOW (ref >60.00)
Glucose, Bld: 109 mg/dL — ABNORMAL HIGH (ref 70–99)
Potassium: 4.2 mEq/L (ref 3.5–5.1)
Sodium: 141 mEq/L (ref 135–145)

## 2016-02-06 NOTE — Telephone Encounter (Signed)
Patient came in for a BP check with me (per last lab test results).  BP was 118/90-pulse 72 and rechecked with readings of 120/90-pulse 68.  Dr Maudie Mercury was informed of this and stated to let the pt know the BP is mildly elevated, remain off of any supplements and anti-inflammatories and she recommends he follow up in 1-2 months and await the lab test results from today as well.  Patient was informed of this and he scheduled a follow up visit.

## 2016-02-29 ENCOUNTER — Other Ambulatory Visit: Payer: Self-pay | Admitting: Family Medicine

## 2016-04-02 ENCOUNTER — Ambulatory Visit: Payer: 59 | Admitting: Family Medicine

## 2016-04-04 NOTE — Progress Notes (Signed)
HPI:  Follow up:  HTN: -was mildly low and BP med decreased to 1/2 tablet; mildly elevated at nurse check in August, here for recheck -meds: telmisartan-hctz -reports:doing well, BP lower range on checks elsewhere -reports getting regular exercise and healthy diet -denies:CP, SOB, dizziness  Renal insufficiency: -improved significantly with stopping NSAIDs and supplements -occ nsaids, but not on regular basis  HLD: -meds: atorvastatin -stable  Hypothyroidism: -meds: levothyroxine -stable  Just finished prednisone for gout from his orthopedic specialist.  ROS: See pertinent positives and negatives per HPI.  Past Medical History:  Diagnosis Date  . Gout   . Hyperlipidemia   . Hypertension   . Hypothyroidism   . Multiple sclerosis ([place])     No past surgical history on file.  Family History  Problem Relation Age of Onset  . Pancreatic cancer Mother     Social History   Social History  . Marital status: Single    Spouse name: N/A  . Number of children: 2  . Years of education: Law   Occupational History  . attorney    Social History Main Topics  . Smoking status: Never Smoker  . Smokeless tobacco: Never Used  . Alcohol use Yes     Comment: every other day - 2 drinks  . Drug use: No  . Sexual activity: Not Asked   Other Topics Concern  . None   Social History Narrative   Patient lives at home alone. Dating.   Caffeine Use: 1 cup every other day   Christian   No regular exercise, diet is so so   [employer].     Current Outpatient Prescriptions:  .  atorvastatin (LIPITOR) 20 MG tablet, Take 1 tablet by mouth  daily at 6pm., Disp: 90 tablet, Rfl: 0 .  levothyroxine (SYNTHROID, LEVOTHROID) 50 MCG tablet, Take 1 tablet by mouth 30  minutes before breakfast, Disp: 90 tablet, Rfl: 3 .  telmisartan-hydrochlorothiazide (MICARDIS HCT) 40-12.5 MG tablet, Take 1 tablet by mouth  every day (Patient taking differently: Take 1/2 tablet by mouth   every day), Disp: 90 tablet, Rfl: 3  EXAM:  Vitals:   04/05/16 1112  BP: 130/80  Pulse: 66  Resp: 12  Temp: 97.5 F (36.4 C)    Body mass index is 24.22 kg/m.  GENERAL: vitals reviewed and listed above, alert, oriented, appears well hydrated and in no acute distress  HEENT: atraumatic, conjunttiva clear, no obvious abnormalities on inspection of external nose and ears  NECK: no obvious masses on inspection  LUNGS: clear to auscultation bilaterally, no wheezes, rales or rhonchi, good air movement  CV: HRRR, no peripheral edema  MS: moves all extremities without noticeable abnormality  PSYCH: pleasant and cooperative, no obvious depression or anxiety  ASSESSMENT AND PLAN:  Discussed the following assessment and plan:  Essential hypertension - Plan: Basic metabolic panel  Hyperlipidemia, unspecified hyperlipidemia type  Other specified hypothyroidism - Plan: TSH  Renal insufficiency - Plan: Basic metabolic panel  -labs -flu shot in 1-2 weeks advised given prednisone use - he agrees to schedule here or elsewhere -lifestyle recs -6 month follow up -Patient advised to return or notify a doctor immediately if symptoms worsen or persist or new concerns arise.  Patient Instructions  BEFORE YOU LEAVE: -follow up: in 6 months -labs  Continue current blood pressure and cholesterol medications, a healthy diet and regular aerobic exercise.  We have ordered labs or studies at this visit. It can take up to 1-2 weeks for results and  processing. IF results require follow up or explanation, we will call you with instructions. Clinically stable results will be released to your [facility]. If you have not heard from [place] or cannot find your results in [facility] in 2 weeks please contact our office at [phone].  If you are not yet signed up for [facility], please consider signing up.           Colin Benton R., DO

## 2016-04-05 ENCOUNTER — Ambulatory Visit (INDEPENDENT_AMBULATORY_CARE_PROVIDER_SITE_OTHER): Payer: 59 | Admitting: Family Medicine

## 2016-04-05 ENCOUNTER — Encounter: Payer: Self-pay | Admitting: Family Medicine

## 2016-04-05 VITALS — BP 130/80 | HR 66 | Temp 97.5°F | Resp 12 | Ht 70.0 in | Wt 168.8 lb

## 2016-04-05 DIAGNOSIS — E038 Other specified hypothyroidism: Secondary | ICD-10-CM

## 2016-04-05 DIAGNOSIS — I10 Essential (primary) hypertension: Secondary | ICD-10-CM

## 2016-04-05 DIAGNOSIS — E785 Hyperlipidemia, unspecified: Secondary | ICD-10-CM

## 2016-04-05 DIAGNOSIS — M109 Gout, unspecified: Secondary | ICD-10-CM

## 2016-04-05 DIAGNOSIS — N289 Disorder of kidney and ureter, unspecified: Secondary | ICD-10-CM

## 2016-04-05 LAB — BASIC METABOLIC PANEL
BUN: 30 mg/dL — AB (ref 6–23)
CHLORIDE: 103 meq/L (ref 96–112)
CO2: 33 meq/L — AB (ref 19–32)
CREATININE: 1.5 mg/dL (ref 0.40–1.50)
Calcium: 9.3 mg/dL (ref 8.4–10.5)
GFR: 50.34 mL/min — ABNORMAL LOW (ref >60.00)
Glucose, Bld: 98 mg/dL (ref 70–99)
POTASSIUM: 4.3 meq/L (ref 3.5–5.1)
Sodium: 143 mEq/L (ref 135–145)

## 2016-04-05 LAB — TSH: TSH: 3.62 u[IU]/mL (ref 0.35–4.50)

## 2016-04-05 NOTE — Patient Instructions (Addendum)
BEFORE YOU LEAVE: -follow up: in 6 months -labs  Continue current blood pressure and cholesterol medications, a healthy diet and regular aerobic exercise.  We have ordered labs or studies at this visit. It can take up to 1-2 weeks for results and processing. IF results require follow up or explanation, we will call you with instructions. Clinically stable results will be released to your [facility]. If you have not heard from [place] or cannot find your results in [facility] in 2 weeks please contact our office at [phone].  If you are not yet signed up for [facility], please consider signing up.

## 2016-05-28 ENCOUNTER — Other Ambulatory Visit: Payer: Self-pay | Admitting: *Deleted

## 2016-05-28 MED ORDER — ATORVASTATIN CALCIUM 20 MG PO TABS: ORAL_TABLET | ORAL | 0 refills | Status: DC

## 2016-05-28 NOTE — Telephone Encounter (Signed)
Rx done. 

## 2016-06-20 ENCOUNTER — Other Ambulatory Visit: Payer: Self-pay | Admitting: Family Medicine

## 2016-06-29 ENCOUNTER — Encounter: Payer: Self-pay | Admitting: Gastroenterology

## 2016-08-28 ENCOUNTER — Other Ambulatory Visit: Payer: Self-pay | Admitting: Family Medicine

## 2016-09-14 ENCOUNTER — Other Ambulatory Visit: Payer: Self-pay | Admitting: Family Medicine

## 2016-11-16 ENCOUNTER — Other Ambulatory Visit: Payer: Self-pay | Admitting: Family Medicine

## 2017-01-04 ENCOUNTER — Ambulatory Visit: Payer: 59 | Admitting: Family Medicine

## 2017-01-14 NOTE — Progress Notes (Signed)
HPI:  Here for CPE: Due for labs, colon ca screening, prostate ca screening?  -Concerns and/or follow up today:   HTN: -meds: telmisartan-hctz, continues to take 1/2 tablet  Renal insufficiency: -improved significantly with stopping NSAIDs and supplements -occ nsaids, but not on regular basis  HLD: -meds: atorvastatin -stable  Hypothyroidism: -meds: levothyroxine -stable  Reported history of possible MS: -Denies any change or symptoms from this at this time -Has some chronic mild bladder emptying issues, status post evaluation by urology in the past and was told this could be related to his MS, this is unchanged -Per his report, he was managed by a neurologist in the past was on Betaseron briefly, then prior PCP told him his cholesterol medicine could treat this and he decided moving for that this is all he will use and has not seen a neurologist since  -Diet: variety of foods, balance and well rounded, larger portion sizes  -Exercise: no regular exercise  -Diabetes and Dyslipidemia Screening: Fasting for lab work  -Hx of HTN: no  -Vaccines: UTD  -sexual activity: yes, male partner, no new partners  -wants STI testing, Hep C screening (if born 77-1967): no  -FH colon or prstate ca: see FH Last colon cancer screening: Normal colonoscopy just over 10 years ago, he prefers to do Cologuard Last prostate ca screening: Discussed risk and benefits of various options, he does wish to do a PSA, he declined a digital rectal exam  -Alcohol, Tobacco, drug use: see social history  Review of Systems - no fevers, unintentional weight loss, vision loss, hearing loss, chest pain, sob, hemoptysis, melena, hematochezia, hematuria, genital discharge, changing or concerning skin lesions, bleeding, bruising, loc, thoughts of self harm or SI  Past Medical History:  Diagnosis Date  . Gout   . Hyperlipidemia   . Hypertension   . Hypothyroidism   . Multiple sclerosis ([place])      No past surgical history on file.  Family History  Problem Relation Age of Onset  . Pancreatic cancer Mother     Social History   Social History  . Marital status: Single    Spouse name: N/A  . Number of children: 2  . Years of education: Law   Occupational History  . attorney    Social History Main Topics  . Smoking status: Never Smoker  . Smokeless tobacco: Never Used  . Alcohol use Yes     Comment: every other day - 2 drinks  . Drug use: No  . Sexual activity: Not Asked   Other Topics Concern  . None   Social History Narrative   Patient lives at home alone. Dating.   Caffeine Use: 1 cup every other day   [employer]   No regular exercise, diet is so so   [employer].     Current Outpatient Prescriptions:  .  atorvastatin (LIPITOR) 20 MG tablet, TAKE 1 TABLET BY MOUTH  DAILY AT 6PM., Disp: 90 tablet, Rfl: 0 .  levothyroxine (SYNTHROID, LEVOTHROID) 50 MCG tablet, TAKE 1 TABLET BY MOUTH 30  MINUTES BEFORE BREAKFAST, Disp: 90 tablet, Rfl: 2 .  telmisartan-hydrochlorothiazide (MICARDIS HCT) 40-12.5 MG tablet, TAKE 1 TABLET BY MOUTH  EVERY DAY, Disp: 90 tablet, Rfl: 1  EXAM:  Vitals:   01/15/17 0957  BP: 120/82  Pulse: 70  Temp: 97.7 F (36.5 C)  TempSrc: Oral  Weight: 165 lb 9.6 oz (75.1 kg)  Height: 5' 9.25" (1.759 m)    Estimated body mass index is 24.28 kg/m as  calculated from the following:   Height as of this encounter: 5' 9.25" (1.759 m).   Weight as of this encounter: 165 lb 9.6 oz (75.1 kg).  GENERAL: vitals reviewed and listed below, alert, oriented, appears well hydrated and in no acute distress  HEENT: head atraumatic, PERRLA, normal appearance of eyes, ears, nose and mouth. moist mucus membranes.  NECK: supple, no masses or lymphadenopathy  LUNGS: clear to auscultation bilaterally, no rales, rhonchi or wheeze  CV: HRRR, no peripheral edema or cyanosis, normal pedal pulses  ABDOMEN: bowel sounds normal, soft, non tender to  palpation, no masses, no rebound or guarding  GU: Refused  RECTAL: refused  SKIN: no rash or abnormal lesions  MS: normal gait, moves all extremities normally  NEURO: normal gait, speech and thought processing grossly intact, muscle tone grossly intact throughout  PSYCH: normal affect, pleasant and cooperative  ASSESSMENT AND PLAN:  Discussed the following assessment and plan:  Encounter for preventive health examination - Plan: Hemoglobin A1c, PSA -Discussed and advised all [place] preventive services health task force level A and B recommendations for age, sex and risks. --Advised at least 150 minutes of exercise per week and a healthy diet with avoidance of (less then 1 serving per week) processed foods, white starches, red meat, fast foods and sweets and consisting of: * 5-9 servings of fresh fruits and vegetables (not corn or potatoes) *nuts and seeds, beans *olives and olive oil *lean meats such as fish and white chicken  *whole grains -labs, studies and vaccines per orders this encounter  Other specified hypothyroidism - Plan: TSH -continue current medication pending labs  Hyperlipidemia, unspecified hyperlipidemia type - Plan: Lipid panel Essential hypertension - Plan: Basic metabolic panel, CBC -lifestyle recs and cont current tx pending labs -did advise increased exercise -labs pending  Multiple sclerosis ([place]), Chronic -stable  Patient advised to return to clinic immediately if symptoms worsen or persist or new concerns.  Patient Instructions  BEFORE YOU LEAVE: -order cologuard -follow up: 4 - 6 months -labs  We ordered the Cologuard test for colon cancer screening. Please complete this test promptly once the kit arrives. Please contact us if you have not received your kit in the next few weeks.  We have ordered labs or studies at this visit. It can take up to 1-2 weeks for results and processing. IF results require follow up or explanation, we will call you  with instructions. Clinically stable results will be released to your [facility]. If you have not heard from [place] or cannot find your results in [facility] in 2 weeks please contact our office at [phone].  If you are not yet signed up for [facility], please consider signing up.   We recommend the following healthy lifestyle for LIFE: 1) Small portions.   Tip: eat off of a salad plate instead of a dinner plate.  Tip: It is ok to feel hungry after a meal - that likely means you ate an appropriate portion.  Tip: if you need more or a snack choose fruits, veggies and/or a handful of nuts or seeds.  2) Eat a healthy clean diet.   TRY TO EAT: -at least 5-7 servings of low sugar vegetables per day (not corn, potatoes or bananas.) -berries are the best choice if you wish to eat fruit.   -lean meets (fish, chicken or [place] breasts) -vegan proteins for some meals - beans or tofu, whole grains, nuts and seeds -Replace bad fats with good fats - good fats include: fish,  nuts and seeds, canola oil, olive oil -small amounts of low fat or non fat dairy -small amounts of100 % whole grains - check the lables  AVOID: -SUGAR, sweets, anything with added sugar, corn syrup or sweeteners -if you must have a sweetener, small amounts of stevia may be best -sweetened beverages -simple starches (rice, bread, potatoes, pasta, chips, etc - small amounts of 100% whole grains are ok) -red meat, pork, butter -fried foods, fast food, processed food, excessive dairy, eggs and coconut.  3)Get at least 150 minutes of sweaty aerobic exercise per week.   Health Maintenance, Male A healthy lifestyle and preventive care is important for your health and wellness. Ask your health care provider about what schedule of regular examinations is right for you. What should I know about weight and diet? Eat a Healthy Diet  Eat plenty of vegetables, fruits, whole grains, low-fat dairy products, and lean protein.  Do not eat a lot of  foods high in solid fats, added sugars, or salt.  Maintain a Healthy Weight Regular exercise can help you achieve or maintain a healthy weight. You should:  Do at least 150 minutes of exercise each week. The exercise should increase your heart rate and make you sweat (moderate-intensity exercise).  Do strength-training exercises at least twice a week.  Watch Your Levels of Cholesterol and Blood Lipids  Have your blood tested for lipids and cholesterol every 5 years starting at 63 years of age. If you are at high risk for heart disease, you should start having your blood tested when you are 63 years old. You may need to have your cholesterol levels checked more often if: ? Your lipid or cholesterol levels are high. ? You are older than 63 years of age. ? You are at high risk for heart disease.  What should I know about cancer screening? Many types of cancers can be detected early and may often be prevented. Lung Cancer  You should be screened every year for lung cancer if: ? You are a current smoker who has smoked for at least 30 years. ? You are a former smoker who has quit within the past 15 years.  Talk to your health care provider about your screening options, when you should start screening, and how often you should be screened.  Colorectal Cancer  Routine colorectal cancer screening usually begins at 63 years of age and should be repeated every 5-10 years until you are 63 years old. You may need to be screened more often if early forms of precancerous polyps or small growths are found. Your health care provider may recommend screening at an earlier age if you have risk factors for colon cancer.  Your health care provider may recommend using home test kits to check for hidden blood in the stool.  A small camera at the end of a tube can be used to examine your colon (sigmoidoscopy or colonoscopy). This checks for the earliest forms of colorectal cancer.  Prostate and Testicular  Cancer  Depending on your age and overall health, your health care provider may do certain tests to screen for prostate and testicular cancer.  Talk to your health care provider about any symptoms or concerns you have about testicular or prostate cancer.  Skin Cancer  Check your skin from head to toe regularly.  Tell your health care provider about any new moles or changes in moles, especially if: ? There is a change in a mole's size, shape, or color. ? You have  a mole that is larger than a pencil eraser.  Always use sunscreen. Apply sunscreen liberally and repeat throughout the day.  Protect yourself by wearing long sleeves, pants, a wide-brimmed hat, and sunglasses when outside.  What should I know about heart disease, diabetes, and high blood pressure?  If you are 58-15 years of age, have your blood pressure checked every 3-5 years. If you are 37 years of age or older, have your blood pressure checked every year. You should have your blood pressure measured twice-once when you are at a hospital or clinic, and once when you are not at a hospital or clinic. Record the average of the two measurements. To check your blood pressure when you are not at a hospital or clinic, you can use: ? An automated blood pressure machine at a pharmacy. ? A home blood pressure monitor.  Talk to your health care provider about your target blood pressure.  If you are between 66-7 years old, ask your health care provider if you should take aspirin to prevent heart disease.  Have regular diabetes screenings by checking your fasting blood sugar level. ? If you are at a normal weight and have a low risk for diabetes, have this test once every three years after the age of 62. ? If you are overweight and have a high risk for diabetes, consider being tested at a younger age or more often.  A one-time screening for abdominal aortic aneurysm (AAA) by ultrasound is recommended for men aged 59-75 years who are  current or former smokers. What should I know about preventing infection? Hepatitis B If you have a higher risk for hepatitis B, you should be screened for this virus. Talk with your health care provider to find out if you are at risk for hepatitis B infection. Hepatitis C Blood testing is recommended for:  Everyone born from [birth year] through 1967.  Anyone with known risk factors for hepatitis C.  Sexually Transmitted Diseases (STDs)  You should be screened each year for STDs including gonorrhea and chlamydia if: ? You are sexually active and are younger than 63 years of age. ? You are older than 62 years of age and your health care provider tells you that you are at risk for this type of infection. ? Your sexual activity has changed since you were last screened and you are at an increased risk for chlamydia or gonorrhea. Ask your health care provider if you are at risk.  Talk with your health care provider about whether you are at high risk of being infected with HIV. Your health care provider may recommend a prescription medicine to help prevent HIV infection.  What else can I do?  Schedule regular health, dental, and eye exams.  Stay current with your vaccines (immunizations).  Do not use any tobacco products, such as cigarettes, chewing tobacco, and e-cigarettes. If you need help quitting, ask your health care provider.  Limit alcohol intake to no more than 2 drinks per day. One drink equals 12 ounces of beer, 5 ounces of wine, or 1 ounces of hard liquor.  Do not use street drugs.  Do not share needles.  Ask your health care provider for help if you need support or information about quitting drugs.  Tell your health care provider if you often feel depressed.  Tell your health care provider if you have ever been abused or do not feel safe at home. This information is not intended to replace advice given to you by  your health care provider. Make sure you discuss any questions  you have with your health care provider. Document Released: 12/01/2007 Document Revised: 02/01/2016 Document Reviewed: 03/08/2015 Elsevier Interactive Patient Education  2018 [employer].   4)Reduce stress - consider counseling, meditation and relaxation to balance other aspects of your life.          No Follow-up on file.   Colin Benton R., DO

## 2017-01-15 ENCOUNTER — Encounter: Payer: Self-pay | Admitting: Family Medicine

## 2017-01-15 ENCOUNTER — Ambulatory Visit (INDEPENDENT_AMBULATORY_CARE_PROVIDER_SITE_OTHER): Payer: 59 | Admitting: Family Medicine

## 2017-01-15 VITALS — BP 120/82 | HR 70 | Temp 97.7°F | Ht 69.25 in | Wt 165.6 lb

## 2017-01-15 DIAGNOSIS — G35 Multiple sclerosis: Secondary | ICD-10-CM | POA: Diagnosis not present

## 2017-01-15 DIAGNOSIS — I10 Essential (primary) hypertension: Secondary | ICD-10-CM

## 2017-01-15 DIAGNOSIS — E785 Hyperlipidemia, unspecified: Secondary | ICD-10-CM

## 2017-01-15 DIAGNOSIS — Z0000 Encounter for general adult medical examination without abnormal findings: Secondary | ICD-10-CM | POA: Diagnosis not present

## 2017-01-15 DIAGNOSIS — E038 Other specified hypothyroidism: Secondary | ICD-10-CM | POA: Diagnosis not present

## 2017-01-15 LAB — LIPID PANEL
Cholesterol: 175 mg/dL (ref 0–200)
HDL: 45.9 mg/dL (ref >39.00)
LDL CALC: 105 mg/dL — AB (ref 0–99)
NONHDL: 129.01
Total CHOL/HDL Ratio: 4
Triglycerides: 120 mg/dL (ref 0.0–149.0)
VLDL: 24 mg/dL (ref 0.0–40.0)

## 2017-01-15 LAB — HEMOGLOBIN A1C: HEMOGLOBIN A1C: 6 % (ref 4.6–6.5)

## 2017-01-15 LAB — BASIC METABOLIC PANEL
BUN: 29 mg/dL — AB (ref 6–23)
CALCIUM: 9.5 mg/dL (ref 8.4–10.5)
CO2: 28 meq/L (ref 19–32)
CREATININE: 1.44 mg/dL (ref 0.40–1.50)
Chloride: 105 mEq/L (ref 96–112)
GFR: 52.63 mL/min — ABNORMAL LOW (ref >60.00)
GLUCOSE: 106 mg/dL — AB (ref 70–99)
Potassium: 4.9 mEq/L (ref 3.5–5.1)
Sodium: 142 mEq/L (ref 135–145)

## 2017-01-15 LAB — CBC
HCT: 46 % (ref 39.0–52.0)
Hemoglobin: 15 g/dL (ref 13.0–17.0)
MCHC: 32.7 g/dL (ref 30.0–36.0)
MCV: 91.3 fl (ref 78.0–100.0)
Platelets: 251 10*3/uL (ref 150.0–400.0)
RBC: 5.04 Mil/uL (ref 4.22–5.81)
RDW: 14 % (ref 11.5–15.5)
WBC: 6.6 10*3/uL (ref 4.0–10.5)

## 2017-01-15 LAB — TSH: TSH: 2.53 u[IU]/mL (ref 0.35–4.50)

## 2017-01-15 LAB — PSA: PSA: 1.72 ng/mL (ref 0.10–4.00)

## 2017-01-15 NOTE — Patient Instructions (Signed)
BEFORE YOU LEAVE: -order cologuard -follow up: 4 - 6 months -labs  We ordered the Cologuard test for colon cancer screening. Please complete this test promptly once the kit arrives. Please contact us if you have not received your kit in the next few weeks.  We have ordered labs or studies at this visit. It can take up to 1-2 weeks for results and processing. IF results require follow up or explanation, we will call you with instructions. Clinically stable results will be released to your [facility]. If you have not heard from [place] or cannot find your results in [facility] in 2 weeks please contact our office at [phone].  If you are not yet signed up for [facility], please consider signing up.   We recommend the following healthy lifestyle for LIFE: 1) Small portions.   Tip: eat off of a salad plate instead of a dinner plate.  Tip: It is ok to feel hungry after a meal - that likely means you ate an appropriate portion.  Tip: if you need more or a snack choose fruits, veggies and/or a handful of nuts or seeds.  2) Eat a healthy clean diet.   TRY TO EAT: -at least 5-7 servings of low sugar vegetables per day (not corn, potatoes or bananas.) -berries are the best choice if you wish to eat fruit.   -lean meets (fish, chicken or [place] breasts) -vegan proteins for some meals - beans or tofu, whole grains, nuts and seeds -Replace bad fats with good fats - good fats include: fish, nuts and seeds, canola oil, olive oil -small amounts of low fat or non fat dairy -small amounts of100 % whole grains - check the lables  AVOID: -SUGAR, sweets, anything with added sugar, corn syrup or sweeteners -if you must have a sweetener, small amounts of stevia may be best -sweetened beverages -simple starches (rice, bread, potatoes, pasta, chips, etc - small amounts of 100% whole grains are ok) -red meat, pork, butter -fried foods, fast food, processed food, excessive dairy, eggs and coconut.  3)Get at least 150  minutes of sweaty aerobic exercise per week.   Health Maintenance, Male A healthy lifestyle and preventive care is important for your health and wellness. Ask your health care provider about what schedule of regular examinations is right for you. What should I know about weight and diet? Eat a Healthy Diet  Eat plenty of vegetables, fruits, whole grains, low-fat dairy products, and lean protein.  Do not eat a lot of foods high in solid fats, added sugars, or salt.  Maintain a Healthy Weight Regular exercise can help you achieve or maintain a healthy weight. You should:  Do at least 150 minutes of exercise each week. The exercise should increase your heart rate and make you sweat (moderate-intensity exercise).  Do strength-training exercises at least twice a week.  Watch Your Levels of Cholesterol and Blood Lipids  Have your blood tested for lipids and cholesterol every 5 years starting at 63 years of age. If you are at high risk for heart disease, you should start having your blood tested when you are 63 years old. You may need to have your cholesterol levels checked more often if: ? Your lipid or cholesterol levels are high. ? You are older than 63 years of age. ? You are at high risk for heart disease.  What should I know about cancer screening? Many types of cancers can be detected early and may often be prevented. Lung Cancer  You should  be screened every year for lung cancer if: ? You are a current smoker who has smoked for at least 30 years. ? You are a former smoker who has quit within the past 15 years.  Talk to your health care provider about your screening options, when you should start screening, and how often you should be screened.  Colorectal Cancer  Routine colorectal cancer screening usually begins at 63 years of age and should be repeated every 5-10 years until you are 63 years old. You may need to be screened more often if early forms of precancerous polyps or  small growths are found. Your health care provider may recommend screening at an earlier age if you have risk factors for colon cancer.  Your health care provider may recommend using home test kits to check for hidden blood in the stool.  A small camera at the end of a tube can be used to examine your colon (sigmoidoscopy or colonoscopy). This checks for the earliest forms of colorectal cancer.  Prostate and Testicular Cancer  Depending on your age and overall health, your health care provider may do certain tests to screen for prostate and testicular cancer.  Talk to your health care provider about any symptoms or concerns you have about testicular or prostate cancer.  Skin Cancer  Check your skin from head to toe regularly.  Tell your health care provider about any new moles or changes in moles, especially if: ? There is a change in a mole's size, shape, or color. ? You have a mole that is larger than a pencil eraser.  Always use sunscreen. Apply sunscreen liberally and repeat throughout the day.  Protect yourself by wearing long sleeves, pants, a wide-brimmed hat, and sunglasses when outside.  What should I know about heart disease, diabetes, and high blood pressure?  If you are 4-59 years of age, have your blood pressure checked every 3-5 years. If you are 41 years of age or older, have your blood pressure checked every year. You should have your blood pressure measured twice-once when you are at a hospital or clinic, and once when you are not at a hospital or clinic. Record the average of the two measurements. To check your blood pressure when you are not at a hospital or clinic, you can use: ? An automated blood pressure machine at a pharmacy. ? A home blood pressure monitor.  Talk to your health care provider about your target blood pressure.  If you are between 5-46 years old, ask your health care provider if you should take aspirin to prevent heart disease.  Have regular  diabetes screenings by checking your fasting blood sugar level. ? If you are at a normal weight and have a low risk for diabetes, have this test once every three years after the age of 73. ? If you are overweight and have a high risk for diabetes, consider being tested at a younger age or more often.  A one-time screening for abdominal aortic aneurysm (AAA) by ultrasound is recommended for men aged 60-75 years who are current or former smokers. What should I know about preventing infection? Hepatitis B If you have a higher risk for hepatitis B, you should be screened for this virus. Talk with your health care provider to find out if you are at risk for hepatitis B infection. Hepatitis C Blood testing is recommended for:  Everyone born from [birth year] through 1967.  Anyone with known risk factors for hepatitis C.  Sexually Transmitted  Diseases (STDs)  You should be screened each year for STDs including gonorrhea and chlamydia if: ? You are sexually active and are younger than 63 years of age. ? You are older than 63 years of age and your health care provider tells you that you are at risk for this type of infection. ? Your sexual activity has changed since you were last screened and you are at an increased risk for chlamydia or gonorrhea. Ask your health care provider if you are at risk.  Talk with your health care provider about whether you are at high risk of being infected with HIV. Your health care provider may recommend a prescription medicine to help prevent HIV infection.  What else can I do?  Schedule regular health, dental, and eye exams.  Stay current with your vaccines (immunizations).  Do not use any tobacco products, such as cigarettes, chewing tobacco, and e-cigarettes. If you need help quitting, ask your health care provider.  Limit alcohol intake to no more than 2 drinks per day. One drink equals 12 ounces of beer, 5 ounces of wine, or 1 ounces of hard liquor.  Do not use  street drugs.  Do not share needles.  Ask your health care provider for help if you need support or information about quitting drugs.  Tell your health care provider if you often feel depressed.  Tell your health care provider if you have ever been abused or do not feel safe at home. This information is not intended to replace advice given to you by your health care provider. Make sure you discuss any questions you have with your health care provider. Document Released: 12/01/2007 Document Revised: 02/01/2016 Document Reviewed: 03/08/2015 Elsevier Interactive Patient Education  2018 [employer].   4)Reduce stress - consider counseling, meditation and relaxation to balance other aspects of your life.

## 2017-02-07 ENCOUNTER — Other Ambulatory Visit: Payer: Self-pay | Admitting: Family Medicine

## 2017-03-01 ENCOUNTER — Other Ambulatory Visit: Payer: Self-pay | Admitting: Family Medicine

## 2017-06-18 ENCOUNTER — Encounter (HOSPITAL_COMMUNITY): Payer: Self-pay

## 2017-06-18 ENCOUNTER — Emergency Department (HOSPITAL_COMMUNITY)
Admission: EM | Admit: 2017-06-18 | Discharge: 2017-06-18 | Disposition: A | Discharge status: Home / Self Care | Payer: 59 | Attending: Emergency Medicine | Admitting: Emergency Medicine

## 2017-06-18 ENCOUNTER — Emergency Department (HOSPITAL_COMMUNITY): Payer: 59

## 2017-06-18 DIAGNOSIS — E039 Hypothyroidism, unspecified: Secondary | ICD-10-CM | POA: Insufficient documentation

## 2017-06-18 DIAGNOSIS — I10 Essential (primary) hypertension: Secondary | ICD-10-CM | POA: Diagnosis not present

## 2017-06-18 DIAGNOSIS — R319 Hematuria, unspecified: Secondary | ICD-10-CM | POA: Insufficient documentation

## 2017-06-18 DIAGNOSIS — N200 Calculus of kidney: Secondary | ICD-10-CM | POA: Diagnosis not present

## 2017-06-18 DIAGNOSIS — R110 Nausea: Secondary | ICD-10-CM | POA: Insufficient documentation

## 2017-06-18 DIAGNOSIS — Z79899 Other long term (current) drug therapy: Secondary | ICD-10-CM | POA: Insufficient documentation

## 2017-06-18 DIAGNOSIS — N132 Hydronephrosis with renal and ureteral calculous obstruction: Secondary | ICD-10-CM | POA: Diagnosis not present

## 2017-06-18 DIAGNOSIS — R109 Unspecified abdominal pain: Secondary | ICD-10-CM | POA: Diagnosis present

## 2017-06-18 LAB — CBC
HEMATOCRIT: 40.6 % (ref 39.0–52.0)
Hemoglobin: 13.3 g/dL (ref 13.0–17.0)
MCH: 29.5 pg (ref 26.0–34.0)
MCHC: 32.8 g/dL (ref 30.0–36.0)
MCV: 90 fL (ref 78.0–100.0)
Platelets: 319 10*3/uL (ref 150–400)
RBC: 4.51 MIL/uL (ref 4.22–5.81)
RDW: 12.9 % (ref 11.5–15.5)
WBC: 13.8 10*3/uL — ABNORMAL HIGH (ref 4.0–10.5)

## 2017-06-18 LAB — COMPREHENSIVE METABOLIC PANEL
ALBUMIN: 3.4 g/dL — AB (ref 3.5–5.0)
ALT: 54 U/L (ref 17–63)
AST: 40 U/L (ref 15–41)
Alkaline Phosphatase: 110 U/L (ref 38–126)
Anion gap: 9 (ref 5–15)
BUN: 23 mg/dL — AB (ref 6–20)
CHLORIDE: 104 mmol/L (ref 101–111)
CO2: 28 mmol/L (ref 22–32)
Calcium: 9.1 mg/dL (ref 8.9–10.3)
Creatinine, Ser: 1.81 mg/dL — ABNORMAL HIGH (ref 0.61–1.24)
GFR calc Af Amer: 44 mL/min — ABNORMAL LOW (ref >60)
GFR calc non Af Amer: 38 mL/min — ABNORMAL LOW (ref >60)
GLUCOSE: 123 mg/dL — AB (ref 65–99)
POTASSIUM: 4.6 mmol/L (ref 3.5–5.1)
SODIUM: 141 mmol/L (ref 135–145)
Total Bilirubin: 1 mg/dL (ref 0.3–1.2)
Total Protein: 6.9 g/dL (ref 6.5–8.1)

## 2017-06-18 LAB — LIPASE, BLOOD: LIPASE: 38 U/L (ref 11–51)

## 2017-06-18 MED ORDER — HYDROMORPHONE HCL 1 MG/ML IJ SOLN
1.0000 mg | Freq: Once | INTRAMUSCULAR | Status: AC
  Administered 2017-06-18: 1 mg via INTRAVENOUS
  Filled 2017-06-18: qty 1

## 2017-06-18 MED ORDER — OXYCODONE-ACETAMINOPHEN 5-325 MG PO TABS: 1.0000 | ORAL_TABLET | ORAL | 0 refills | Status: DC | PRN

## 2017-06-18 MED ORDER — ONDANSETRON 4 MG PO TBDP: 4.0000 mg | ORAL_TABLET | Freq: Three times a day (TID) | ORAL | 0 refills | Status: DC | PRN

## 2017-06-18 MED ORDER — PROMETHAZINE HCL 25 MG PO TABS: 25.0000 mg | ORAL_TABLET | Freq: Four times a day (QID) | ORAL | 0 refills | Status: DC | PRN

## 2017-06-18 MED ORDER — KETOROLAC TROMETHAMINE 30 MG/ML IJ SOLN
30.0000 mg | Freq: Once | INTRAMUSCULAR | Status: AC
  Administered 2017-06-18: 30 mg via INTRAVENOUS
  Filled 2017-06-18: qty 1

## 2017-06-18 NOTE — ED Triage Notes (Signed)
Onset 3-4 hours PTA RLQ abd pain, feels constipated, and urinary hesitancy.  Blood noted in urine this morning.

## 2017-06-18 NOTE — ED Notes (Signed)
Pt stable, amubulatory, states understanding of discharge instructions

## 2017-06-18 NOTE — ED Provider Notes (Signed)
[facility] EMERGENCY DEPARTMENT Provider Note   CSN: [id number] Arrival date & time: 06/18/17  1746     History   Chief Complaint Chief Complaint  Patient presents with  . Abdominal Pain    HPI KANIEL KIANG is a 62 y.o. male.  HPI  The patient is a 62 year old male, he presents to the hospital today with a complaint of right-sided abdominal pain which started approximately 4 hours ago.  He states this was right mid abdomen, sharp and stabbing, persistent, radiates down to the scrotum and groin, no associated back pain but he did have some hematuria this morning.  He has been nauseated but not vomiting, the pain is severe, persistent, nothing seems to make it better or worse.  No history of kidney stones, no history of abdominal surgery.  Past Medical History:  Diagnosis Date  . Gout   . Hyperlipidemia   . Hypertension   . Hypothyroidism   . Multiple sclerosis [facility])     Patient Active Problem List   Diagnosis Date Noted  . Hypothyroidism 09/16/2007  . Essential hypertension 07/17/2007  . Hyperlipemia 04/10/2007  . Gout 04/10/2007  . MULTIPLE SCLEROSIS 04/02/2007    History reviewed. No pertinent surgical history.     Home Medications    Prior to Admission medications   Medication Sig Start Date End Date Taking? Authorizing Provider  atorvastatin (LIPITOR) 20 MG tablet TAKE 1 TABLET BY MOUTH  DAILY AT 6:00PM. 02/08/17   Lucretia Kern, DO  levothyroxine (SYNTHROID, LEVOTHROID) 50 MCG tablet TAKE 1 TABLET BY MOUTH 30  MINUTES BEFORE BREAKFAST 08/28/16   Lucretia Kern, DO  ondansetron (ZOFRAN ODT) 4 MG disintegrating tablet Take 1 tablet (4 mg total) by mouth every 8 (eight) hours as needed for nausea. 06/18/17   Noemi Chapel, MD  oxyCODONE-acetaminophen (PERCOCET) 5-325 MG tablet Take 1 tablet by mouth every 4 (four) hours as needed. 06/18/17   Noemi Chapel, MD  promethazine (PHENERGAN) 25 MG tablet Take 1 tablet (25 mg total) by mouth every 6 (six)  hours as needed for nausea or vomiting. 06/18/17   Noemi Chapel, MD  telmisartan-hydrochlorothiazide (MICARDIS HCT) 40-12.5 MG tablet TAKE 1 TABLET BY MOUTH  EVERY DAY 03/04/17   Lucretia Kern, DO    Family History Family History  Problem Relation Age of Onset  . Pancreatic cancer Mother     Social History Social History   Tobacco Use  . Smoking status: Never Smoker  . Smokeless tobacco: Never Used  Substance Use Topics  . Alcohol use: Yes    Comment: every other day - 2 drinks  . Drug use: No     Allergies   Patient has no known allergies.   Review of Systems Review of Systems  All other systems reviewed and are negative.    Physical Exam Updated Vital Signs BP (!) 144/96   Pulse 99   Temp 97.7 F (36.5 C) (Oral)   Resp 20   SpO2 98%   Physical Exam  Constitutional: He appears well-developed and well-nourished. No distress.  HENT:  Head: Normocephalic and atraumatic.  Mouth/Throat: Oropharynx is clear and moist. No oropharyngeal exudate.  Eyes: Conjunctivae and EOM are normal. Pupils are equal, round, and reactive to light. Right eye exhibits no discharge. Left eye exhibits no discharge. No scleral icterus.  Neck: Normal range of motion. Neck supple. No JVD present. No thyromegaly present.  Cardiovascular: Normal rate, regular rhythm, normal heart sounds and intact distal pulses. Exam  reveals no gallop and no friction rub.  No murmur heard. Pulmonary/Chest: Effort normal and breath sounds normal. No respiratory distress. He has no wheezes. He has no rales.  Abdominal: Soft. Bowel sounds are normal. He exhibits no distension and no mass. There is tenderness.  Tenderness in the right flank and right lower quadrant, no left-sided tenderness  Musculoskeletal: Normal range of motion. He exhibits no edema or tenderness.  Lymphadenopathy:    He has no cervical adenopathy.  Neurological: He is alert. Coordination normal.  Skin: Skin is warm and dry. No rash noted. No  erythema.  Psychiatric: He has a normal mood and affect. His behavior is normal.  Nursing note and vitals reviewed.    ED Treatments / Results  Labs (all labs ordered are listed, but only abnormal results are displayed) Labs Reviewed  COMPREHENSIVE METABOLIC PANEL - Abnormal; Notable for the following components:      Result Value   Glucose, Bld 123 (*)    BUN 23 (*)    Creatinine, Ser 1.81 (*)    Albumin 3.4 (*)    GFR calc non Af Amer 38 (*)    GFR calc Af Amer 44 (*)    All other components within normal limits  CBC - Abnormal; Notable for the following components:   WBC 13.8 (*)    All other components within normal limits  LIPASE, BLOOD  URINALYSIS, ROUTINE W REFLEX MICROSCOPIC     Radiology Ct Renal Stone Study  Result Date: 06/18/2017 CLINICAL DATA:  Acute onset of right lower quadrant pain. Flank pain. EXAM: CT ABDOMEN AND PELVIS WITHOUT CONTRAST TECHNIQUE: Multidetector CT imaging of the abdomen and pelvis was performed following the standard protocol without IV contrast. COMPARISON:  None. FINDINGS: Lower chest: The lung bases are clear without focal nodule, mass, or airspace disease. The heart size is normal. No significant pleural or pericardial effusion is present. Hepatobiliary: No focal liver abnormality is seen. No gallstones, gallbladder wall thickening, or biliary dilatation. Pancreas: Unremarkable. No pancreatic ductal dilatation or surrounding inflammatory changes. Spleen: Normal in size without focal abnormality. Adrenals/Urinary Tract: Adrenal glands are normal bilaterally. There is diffuse stranding about the right kidney with moderate right-sided hydronephrosis. Right ureter is dilated to the level of the UVJ where a 4.5 mm obstructing stone is present. 2 additional punctate nonobstructing stones are present in the right kidney, 1 at the lower pole and 1 at the upper pole. The left kidney is unremarkable. There are no stones. The ureter is within normal limits.  The urinary bladder is unremarkable. Stomach/Bowel: The stomach and duodenum are within normal limits. The small bowel is unremarkable. The terminal ileum is within normal limits. The appendix is normal. The ascending and transverse colon are unremarkable. The descending and sigmoid colon are within normal limits as well. Vascular/Lymphatic: Vascular calcifications are present in the aorta and branch vessels without aneurysm. No significant adenopathy is present. Reproductive: Prostate is unremarkable. Other: No abdominal wall hernia or abnormality. No abdominopelvic ascites. Musculoskeletal: Grade 1 degenerative anterolisthesis is present at L4-5. Mild leftward curvature is also centered at L4-5. Vertebral heights are maintained. No focal lytic or blastic lesions are present. The bony pelvis is intact. Hips are located and within normal limits bilaterally. IMPRESSION: 1. Obstructing 4.5 mm distal right ureteral stone at the UVJ with moderate right-sided hydroureteronephrosis. 2. Two additional punctate nonobstructing stones within the right kidney. 3. No significant left-sided stent. 4. Degenerative grade 1 anterolisthesis secondary to facet disease at L4-5. Electronically Signed  By: San Morelle M.D.   On: 06/18/2017 19:44    Procedures Procedures (including critical care time)  Medications Ordered in ED Medications  ketorolac (TORADOL) 30 MG/ML injection 30 mg (30 mg Intravenous Given 06/18/17 1929)  HYDROmorphone (DILAUDID) injection 1 mg (1 mg Intravenous Given 06/18/17 1929)     Initial Impression / Assessment and Plan / ED Course  I have reviewed the triage vital signs and the nursing notes.  Pertinent labs & imaging results that were available during my care of the patient were reviewed by me and considered in my medical decision making (see chart for details).     The patient was informed of his renal insufficiency function compared to baseline and encouraged to drink fluids and  follow-up for recheck.  He was also informed of his CT scan showing a distal right ureteral stone.  Mild hydronephrosis, the patient is pain-free after medications and agreeable to discharge with the medications as above.  Stable for discharge  Final Clinical Impressions(s) / ED Diagnoses   Final diagnoses:  Kidney stone on right side    ED Discharge Orders        Ordered    oxyCODONE-acetaminophen (PERCOCET) 5-325 MG tablet  Every 4 hours PRN     06/18/17 2116    ondansetron (ZOFRAN ODT) 4 MG disintegrating tablet  Every 8 hours PRN     06/18/17 2116    promethazine (PHENERGAN) 25 MG tablet  Every 6 hours PRN     06/18/17 2116       Noemi Chapel, MD 06/18/17 2119

## 2017-06-18 NOTE — ED Notes (Signed)
Pt in b/r

## 2017-06-18 NOTE — Discharge Instructions (Signed)
Please read the attached instructions regarding kidney stones.    Percocet is a very strong pain medication - please do not drive while taking this medicine

## 2017-06-27 DIAGNOSIS — M109 Gout, unspecified: Secondary | ICD-10-CM | POA: Diagnosis not present

## 2017-06-27 DIAGNOSIS — M25561 Pain in right knee: Secondary | ICD-10-CM | POA: Diagnosis not present

## 2017-06-27 DIAGNOSIS — M10471 Other secondary gout, right ankle and foot: Secondary | ICD-10-CM | POA: Diagnosis not present

## 2017-06-27 DIAGNOSIS — M25571 Pain in right ankle and joints of right foot: Secondary | ICD-10-CM | POA: Diagnosis not present

## 2017-06-27 DIAGNOSIS — Z8739 Personal history of other diseases of the musculoskeletal system and connective tissue: Secondary | ICD-10-CM | POA: Diagnosis not present

## 2017-06-27 DIAGNOSIS — M10472 Other secondary gout, left ankle and foot: Secondary | ICD-10-CM | POA: Diagnosis not present

## 2017-06-27 DIAGNOSIS — M10062 Idiopathic gout, left knee: Secondary | ICD-10-CM | POA: Diagnosis not present

## 2017-07-08 DIAGNOSIS — M25569 Pain in unspecified knee: Secondary | ICD-10-CM | POA: Diagnosis not present

## 2017-07-08 DIAGNOSIS — M109 Gout, unspecified: Secondary | ICD-10-CM | POA: Diagnosis not present

## 2017-07-08 DIAGNOSIS — M79673 Pain in unspecified foot: Secondary | ICD-10-CM | POA: Diagnosis not present

## 2017-07-13 NOTE — Progress Notes (Signed)
HPI:  Adam Oliver is a pleasant 62 y.o. here for follow up. Chronic medical problems summarized below were reviewed for changes and stability and were updated as needed below. These issues and their treatment remain stable for the most part. In emergency room a few weeks ago with obstructing 4.5 mm R ureteral stone, AoCRI, hydronephrosis.  Discharged without instructions with pain and nausea meds. Reports the flank pain and symptoms resolved completely right after discharge.  Denies any further flank, pelvic or abdominal pain or hematuria since this appointment. He is seeing a rheumatologist about gout, he had been seeing his orthopedic specialist.  He is now on allopurinol and is doing much better.  He is finishing up a course of prednisone for this. He has really changed his diet a lot after the flare with the gout and a lot more water to be meats and also beer.  He has lost some weight with this.  He thinks this is due to the diet changes. He has had some intermittent and progressively worse urinary symptoms for some time.  His symptoms consist of increasing nocturia with at least 3 times a night of getting up to go to the bathroom.  He also has some incontinence that usually occurs at night.  He has seen a urologist in the past, but reports urologist retired.  He is interested in seeing a urologist again. Denies CP, SOB, DOE, treatment intolerance or new symptoms. Due for labs, colon ca screening, flu vaccine  HTN: -meds: telmisartan-hctz, continues to take 1/2 tablet  Renal insufficiency: -improved significantly with stopping NSAIDs and supplements -occ nsaids, but not on regular basis  HLD/Hyperglycemia: -meds: atorvastatin -stable  Hypothyroidism: -meds: levothyroxine -stable  Reported history of possible MS: -Denies any change or symptoms from this at this time -Has some chronic mild bladder emptying issues, status post evaluation by urology in the past and was told this  could be related to his MS -Per his report, he was managed by a neurologist in the past was on Betaseron briefly, then prior PCP told him his cholesterol medicine could treat this and he decided moving forward that this is all he will use and has not seen a neurologist since  ROS: See pertinent positives and negatives per HPI.  Past Medical History:  Diagnosis Date  . Gout   . Hyperlipidemia   . Hypertension   . Hypothyroidism   . Multiple sclerosis ([place])     History reviewed. No pertinent surgical history.  Family History  Problem Relation Age of Onset  . Pancreatic cancer Mother     Social History   Socioeconomic History  . Marital status: Single    Spouse name: None  . Number of children: 2  . Years of education: Law  . Highest education level: None  Social Needs  . Financial resource strain: None  . Food insecurity - worry: None  . Food insecurity - inability: None  . Transportation needs - medical: None  . Transportation needs - non-medical: None  Occupational History  . Occupation: attorney  Tobacco Use  . Smoking status: Never Smoker  . Smokeless tobacco: Never Used  Substance and Sexual Activity  . Alcohol use: Yes    Comment: every other day - 2 drinks  . Drug use: No  . Sexual activity: None  Other Topics Concern  . None  Social History Narrative   Patient lives at home alone. Dating.   Caffeine Use: 1 cup every other day   [employer]  No regular exercise, diet is so so   [employer].     Current Outpatient Medications:  .  allopurinol (ZYLOPRIM) 100 MG tablet, 1 TABLET ONCE A DAY X 2 WEEKS THEN TWICE A DAY ORALLY 30 DAY(S), Disp: , Rfl: 0 .  atorvastatin (LIPITOR) 20 MG tablet, TAKE 1 TABLET BY MOUTH  DAILY AT 6:00PM., Disp: 90 tablet, Rfl: 1 .  levothyroxine (SYNTHROID, LEVOTHROID) 50 MCG tablet, TAKE 1 TABLET BY MOUTH 30  MINUTES BEFORE BREAKFAST, Disp: 90 tablet, Rfl: 2 .  telmisartan (MICARDIS) 40 MG tablet, Take 0.5 tablets (20 mg  total) by mouth daily., Disp: 45 tablet, Rfl: 3  EXAM:  Vitals:   07/16/17 0851  BP: (!) 82/60  Pulse: 82  Temp: 97.6 F (36.4 C)    Body mass index is 23.33 kg/m.  GENERAL: vitals reviewed and listed above, alert, oriented, appears well hydrated and in no acute distress  HEENT: atraumatic, conjunttiva clear, no obvious abnormalities on inspection of external nose and ears  NECK: no obvious masses on inspection  LUNGS: clear to auscultation bilaterally, no wheezes, rales or rhonchi, good air movement  CV: HRRR, no peripheral edema  ABD: Soft, no CVA tenderness  MS: moves all extremities without noticeable abnormality  PSYCH: pleasant and cooperative, no obvious depression or anxiety  ASSESSMENT AND PLAN:  Discussed the following assessment and plan:  Nephrolithiasis - Plan: Ambulatory referral to Urology  Urinary incontinence, unspecified type - Plan: Ambulatory referral to Urology  Nocturia - Plan: Ambulatory referral to Urology  Essential hypertension - Plan: Basic metabolic panel, CBC  Renal insufficiency  Hyperglycemia - Plan: Hemoglobin A1c  Hyperlipidemia, unspecified hyperlipidemia type  Gout, unspecified cause, unspecified chronicity, unspecified site  Hypothyroidism, unspecified type - Plan: TSH  -We will refer him to the urologist for the recent kidney stone and his urinary symptoms, he has a history of MS -We will get some labs today -Also change up his blood pressure medicine given his low blood pressure, urinary symptoms and the gout.  Stopped the diuretic. -Advised of health maintenance measures that are due -Patient advised to return or notify a doctor immediately if symptoms worsen or persist or new concerns arise.  Patient Instructions  BEFORE YOU LEAVE: -labs -follow up: 3-4 months  -stop the combo blood pressure medication and start the telmisartan instead.  -We placed a referral for you as discussed to the urologist. It usually  takes about 1-2 weeks to process and schedule this referral. If you have not heard from [place] regarding this appointment in 2 weeks please contact our office.  We have ordered labs or studies at this visit. It can take up to 1-2 weeks for results and processing. IF results require follow up or explanation, we will call you with instructions. Clinically stable results will be released to your [facility]. If you have not heard from [place] or cannot find your results in [facility] in 2 weeks please contact our office at [phone].  If you are not yet signed up for [facility], please consider signing up.            Lucretia Kern, DO

## 2017-07-16 ENCOUNTER — Encounter: Payer: Self-pay | Admitting: Family Medicine

## 2017-07-16 ENCOUNTER — Ambulatory Visit: Payer: 59 | Admitting: Family Medicine

## 2017-07-16 VITALS — BP 82/60 | HR 82 | Temp 97.6°F | Ht 69.25 in | Wt 159.1 lb

## 2017-07-16 DIAGNOSIS — R351 Nocturia: Secondary | ICD-10-CM | POA: Diagnosis not present

## 2017-07-16 DIAGNOSIS — E039 Hypothyroidism, unspecified: Secondary | ICD-10-CM | POA: Diagnosis not present

## 2017-07-16 DIAGNOSIS — N289 Disorder of kidney and ureter, unspecified: Secondary | ICD-10-CM

## 2017-07-16 DIAGNOSIS — N200 Calculus of kidney: Secondary | ICD-10-CM

## 2017-07-16 DIAGNOSIS — R739 Hyperglycemia, unspecified: Secondary | ICD-10-CM

## 2017-07-16 DIAGNOSIS — I10 Essential (primary) hypertension: Secondary | ICD-10-CM | POA: Diagnosis not present

## 2017-07-16 DIAGNOSIS — M109 Gout, unspecified: Secondary | ICD-10-CM

## 2017-07-16 DIAGNOSIS — R32 Unspecified urinary incontinence: Secondary | ICD-10-CM | POA: Diagnosis not present

## 2017-07-16 DIAGNOSIS — E785 Hyperlipidemia, unspecified: Secondary | ICD-10-CM

## 2017-07-16 LAB — CBC
HCT: 43.3 % (ref 39.0–52.0)
Hemoglobin: 14.3 g/dL (ref 13.0–17.0)
MCHC: 33.1 g/dL (ref 30.0–36.0)
MCV: 88.7 fl (ref 78.0–100.0)
PLATELETS: 305 10*3/uL (ref 150.0–400.0)
RBC: 4.88 Mil/uL (ref 4.22–5.81)
RDW: 13.3 % (ref 11.5–15.5)
WBC: 10.4 10*3/uL (ref 4.0–10.5)

## 2017-07-16 LAB — HEMOGLOBIN A1C: HEMOGLOBIN A1C: 6.6 % — AB (ref 4.6–6.5)

## 2017-07-16 LAB — BASIC METABOLIC PANEL
BUN: 30 mg/dL — ABNORMAL HIGH (ref 6–23)
CALCIUM: 9 mg/dL (ref 8.4–10.5)
CO2: 31 mEq/L (ref 19–32)
Chloride: 101 mEq/L (ref 96–112)
Creatinine, Ser: 1.39 mg/dL (ref 0.40–1.50)
GFR: 54.74 mL/min — AB (ref >60.00)
GLUCOSE: 100 mg/dL — AB (ref 70–99)
Potassium: 4.7 mEq/L (ref 3.5–5.1)
SODIUM: 140 meq/L (ref 135–145)

## 2017-07-16 LAB — TSH: TSH: 2.61 u[IU]/mL (ref 0.35–4.50)

## 2017-07-16 MED ORDER — TELMISARTAN 40 MG PO TABS: 20.0000 mg | ORAL_TABLET | Freq: Every day | ORAL | 3 refills | Status: DC

## 2017-07-16 NOTE — Patient Instructions (Addendum)
BEFORE YOU LEAVE: -labs -follow up: 3-4 months  -stop the combo blood pressure medication and start the telmisartan instead.  -We placed a referral for you as discussed to the urologist. It usually takes about 1-2 weeks to process and schedule this referral. If you have not heard from [place] regarding this appointment in 2 weeks please contact our office.  We have ordered labs or studies at this visit. It can take up to 1-2 weeks for results and processing. IF results require follow up or explanation, we will call you with instructions. Clinically stable results will be released to your [facility]. If you have not heard from [place] or cannot find your results in [facility] in 2 weeks please contact our office at [phone].  If you are not yet signed up for [facility], please consider signing up.

## 2017-07-19 ENCOUNTER — Other Ambulatory Visit: Payer: Self-pay | Admitting: Family Medicine

## 2017-07-22 ENCOUNTER — Other Ambulatory Visit: Payer: Self-pay | Admitting: Family Medicine

## 2017-07-23 ENCOUNTER — Telehealth: Payer: Self-pay | Admitting: Family Medicine

## 2017-07-23 NOTE — Telephone Encounter (Signed)
Pt return call and lab results given to him with verbal understanding. Pt also would like a copy of his labs printed and he will pick them up from the office.

## 2017-07-24 ENCOUNTER — Encounter: Payer: Self-pay | Admitting: *Deleted

## 2017-07-24 NOTE — Telephone Encounter (Signed)
Letter printed and filed at front desk for pickup.

## 2017-08-09 ENCOUNTER — Other Ambulatory Visit: Payer: Self-pay | Admitting: Family Medicine

## 2017-08-09 DIAGNOSIS — M1990 Unspecified osteoarthritis, unspecified site: Secondary | ICD-10-CM | POA: Diagnosis not present

## 2017-08-09 DIAGNOSIS — M109 Gout, unspecified: Secondary | ICD-10-CM | POA: Diagnosis not present

## 2017-08-09 DIAGNOSIS — Z79899 Other long term (current) drug therapy: Secondary | ICD-10-CM | POA: Diagnosis not present

## 2017-08-12 ENCOUNTER — Other Ambulatory Visit (INDEPENDENT_AMBULATORY_CARE_PROVIDER_SITE_OTHER): Payer: 59

## 2017-08-12 DIAGNOSIS — M109 Gout, unspecified: Secondary | ICD-10-CM

## 2017-08-12 LAB — URIC ACID: URIC ACID, SERUM: 6.3 mg/dL (ref 4.0–7.8)

## 2017-09-16 DIAGNOSIS — N202 Calculus of kidney with calculus of ureter: Secondary | ICD-10-CM | POA: Diagnosis not present

## 2017-09-16 DIAGNOSIS — N401 Enlarged prostate with lower urinary tract symptoms: Secondary | ICD-10-CM | POA: Diagnosis not present

## 2017-09-30 DIAGNOSIS — M180 Bilateral primary osteoarthritis of first carpometacarpal joints: Secondary | ICD-10-CM | POA: Diagnosis not present

## 2017-10-06 DIAGNOSIS — N200 Calculus of kidney: Secondary | ICD-10-CM | POA: Diagnosis not present

## 2017-11-11 NOTE — Progress Notes (Signed)
HPI:  Using dictation device. Unfortunately this device frequently misinterprets words/phrases.  Adam Oliver is a pleasant 62 y.o. here for follow up. Chronic medical problems summarized below were reviewed for changes. Doing well. Reports saw urologist and told kidneys looked good on repeat US. Reports urinary symptoms resolved - of HCT. Continues to exercise and eat healthy. No gout issues.. Denies CP, SOB, DOE, treatment intolerance or new symptoms. Due for labs: hgba1c, tsh, bmp, cbc Did not complete the cologuard - has at home  HTN: -meds: telmisartan-hctz in the past; stopped hctz due to gout and urinary frequency 2019  Renal insufficiency: -improved significantly with stopping NSAIDs and supplements -occ nsaids, but not on regular basis  HLD/Hyperglycemia: -meds: atorvastatin -stable  Hypothyroidism: -meds: levothyroxine -stable  Nocturia/urinary incontinence: -seen by urology in the past -referred again in 2019 - resolved off hctz -hx hydronephrosis, nephrolithiasis  Gout: -sees rheumatologist -meds: allopurinol  Reported history of possible MS: -Denies any change or symptoms from this at this time -Has some chronic mild bladder emptying issues, status post evaluation by urology in the past and was told this could be related to his MS -Per his report, he was managed by a neurologist in the past was on Betaseron briefly,then prior PCP told him his cholesterol medicine could treat this and he decided moving forward that this is all he will use and has not seen a neurologist since   ROS: See pertinent positives and negatives per HPI.  Past Medical History:  Diagnosis Date  . Gout   . Hyperlipidemia   . Hypertension   . Hypothyroidism   . Multiple sclerosis ([place])     History reviewed. No pertinent surgical history.  Family History  Problem Relation Age of Onset  . Pancreatic cancer Mother     SOCIAL HX: see hpi   Current Outpatient  Medications:  .  allopurinol (ZYLOPRIM) 100 MG tablet, 1 TABLET ONCE A DAY X 2 WEEKS THEN TWICE A DAY ORALLY 30 DAY(S), Disp: , Rfl: 0 .  atorvastatin (LIPITOR) 20 MG tablet, TAKE 1 TABLET BY MOUTH  DAILY AT 6:00PM., Disp: 90 tablet, Rfl: 1 .  levothyroxine (SYNTHROID, LEVOTHROID) 50 MCG tablet, TAKE 1 TABLET BY MOUTH 30  MINUTES BEFORE BREAKFAST, Disp: 90 tablet, Rfl: 2 .  telmisartan (MICARDIS) 40 MG tablet, Take 0.5 tablets (20 mg total) by mouth daily., Disp: 45 tablet, Rfl: 3  EXAM:  Vitals:   11/12/17 0942  BP: 100/80  Pulse: 71  Temp: 97.9 F (36.6 C)    Body mass index is 24.19 kg/m.  GENERAL: vitals reviewed and listed above, alert, oriented, appears well hydrated and in no acute distress  HEENT: atraumatic, conjunttiva clear, no obvious abnormalities on inspection of external nose and ears  NECK: no obvious masses on inspection  LUNGS: clear to auscultation bilaterally, no wheezes, rales or rhonchi, good air movement  CV: HRRR, no peripheral edema  MS: moves all extremities without noticeable abnormality  PSYCH: pleasant and cooperative, no obvious depression or anxiety  ASSESSMENT AND PLAN:  Discussed the following assessment and plan:  Essential hypertension - Plan: Basic metabolic panel, CBC  Hyperlipidemia, unspecified hyperlipidemia type  Hyperglycemia - Plan: Hemoglobin A1c  Other specified hypothyroidism - Plan: TSH  Gout, unspecified cause, unspecified chronicity, unspecified site  -glad he is doing better -labs -continue healthy lifestyle -advised to complete cologuard test this week -f/u 4-6 months, sooner as needed  Patient Instructions  BEFORE YOU LEAVE: -labs -follow up: 4-6 months  COMPLETE the  colon cancer screening kit!  We have ordered labs or studies at this visit. It can take up to 1-2 weeks for results and processing. IF results require follow up or explanation, we will call you with instructions. Clinically stable results will  be released to your [facility]. If you have not heard from [place] or cannot find your results in [facility] in 2 weeks please contact our office at [phone].  If you are not yet signed up for [facility], please consider signing up.   We recommend the following healthy lifestyle for LIFE: 1) Small portions. But, make sure to get regular (at least 3 per day), healthy meals and small healthy snacks if needed.  2) Eat a healthy clean diet.   TRY TO EAT: -at least 5-7 servings of low sugar, colorful, and nutrient rich vegetables per day (not corn, potatoes or bananas.) -berries are the best choice if you wish to eat fruit (only eat small amounts if trying to reduce weight)  -lean meets (fish, white meat of chicken or [place]) -vegan proteins for some meals - beans or tofu, whole grains, nuts and seeds -Replace bad fats with good fats - good fats include: fish, nuts and seeds, canola oil, olive oil -small amounts of low fat or non fat dairy -small amounts of100 % whole grains - check the lables -drink plenty of water  AVOID: -SUGAR, sweets, anything with added sugar, corn syrup or sweeteners - must read labels as even foods advertised as "healthy" often are loaded with sugar -if you must have a sweetener, small amounts of stevia may be best -sweetened beverages and artificially sweetened beverages -simple starches (rice, bread, potatoes, pasta, chips, etc - small amounts of 100% whole grains are ok) -red meat, pork, butter -fried foods, fast food, processed food, excessive dairy, eggs and coconut.  3)Get at least 150 minutes of sweaty aerobic exercise per week.  4)Reduce stress - consider counseling, meditation and relaxation to balance other aspects of your life.          Lucretia Kern, DO

## 2017-11-12 ENCOUNTER — Encounter: Payer: Self-pay | Admitting: Family Medicine

## 2017-11-12 ENCOUNTER — Ambulatory Visit: Payer: 59 | Admitting: Family Medicine

## 2017-11-12 VITALS — BP 100/80 | HR 71 | Temp 97.9°F | Ht 69.25 in | Wt 165.0 lb

## 2017-11-12 DIAGNOSIS — R739 Hyperglycemia, unspecified: Secondary | ICD-10-CM | POA: Diagnosis not present

## 2017-11-12 DIAGNOSIS — E785 Hyperlipidemia, unspecified: Secondary | ICD-10-CM

## 2017-11-12 DIAGNOSIS — E038 Other specified hypothyroidism: Secondary | ICD-10-CM

## 2017-11-12 DIAGNOSIS — I10 Essential (primary) hypertension: Secondary | ICD-10-CM

## 2017-11-12 DIAGNOSIS — M109 Gout, unspecified: Secondary | ICD-10-CM

## 2017-11-12 LAB — BASIC METABOLIC PANEL
BUN: 30 mg/dL — ABNORMAL HIGH (ref 6–23)
CO2: 26 meq/L (ref 19–32)
Calcium: 9.3 mg/dL (ref 8.4–10.5)
Chloride: 106 mEq/L (ref 96–112)
Creatinine, Ser: 1.33 mg/dL (ref 0.40–1.50)
GFR: 57.53 mL/min — AB (ref >60.00)
GLUCOSE: 109 mg/dL — AB (ref 70–99)
POTASSIUM: 4.6 meq/L (ref 3.5–5.1)
SODIUM: 141 meq/L (ref 135–145)

## 2017-11-12 LAB — CBC
HCT: 45.1 % (ref 39.0–52.0)
HEMOGLOBIN: 15.3 g/dL (ref 13.0–17.0)
MCHC: 33.9 g/dL (ref 30.0–36.0)
MCV: 90.3 fl (ref 78.0–100.0)
PLATELETS: 258 10*3/uL (ref 150.0–400.0)
RBC: 4.99 Mil/uL (ref 4.22–5.81)
RDW: 14 % (ref 11.5–15.5)
WBC: 6.8 10*3/uL (ref 4.0–10.5)

## 2017-11-12 LAB — TSH: TSH: 2.43 u[IU]/mL (ref 0.35–4.50)

## 2017-11-12 LAB — HEMOGLOBIN A1C: Hgb A1c MFr Bld: 6.1 % (ref 4.6–6.5)

## 2017-11-12 NOTE — Patient Instructions (Addendum)
BEFORE YOU LEAVE: -labs -follow up: 4-6 months  COMPLETE the colon cancer screening kit!  We have ordered labs or studies at this visit. It can take up to 1-2 weeks for results and processing. IF results require follow up or explanation, we will call you with instructions. Clinically stable results will be released to your [facility]. If you have not heard from us or cannot find your results in [facility] in 2 weeks please contact our office at [phone].  If you are not yet signed up for [facility], please consider signing up.   We recommend the following healthy lifestyle for LIFE: 1) Small portions. But, make sure to get regular (at least 3 per day), healthy meals and small healthy snacks if needed.  2) Eat a healthy clean diet.   TRY TO EAT: -at least 5-7 servings of low sugar, colorful, and nutrient rich vegetables per day (not corn, potatoes or bananas.) -berries are the best choice if you wish to eat fruit (only eat small amounts if trying to reduce weight)  -lean meets (fish, white meat of chicken or turkey) -vegan proteins for some meals - beans or tofu, whole grains, nuts and seeds -Replace bad fats with good fats - good fats include: fish, nuts and seeds, canola oil, olive oil -small amounts of low fat or non fat dairy -small amounts of100 % whole grains - check the lables -drink plenty of water  AVOID: -SUGAR, sweets, anything with added sugar, corn syrup or sweeteners - must read labels as even foods advertised as "healthy" often are loaded with sugar -if you must have a sweetener, small amounts of stevia may be best -sweetened beverages and artificially sweetened beverages -simple starches (rice, bread, potatoes, pasta, chips, etc - small amounts of 100% whole grains are ok) -red meat, pork, butter -fried foods, fast food, processed food, excessive dairy, eggs and coconut.  3)Get at least 150 minutes of sweaty aerobic exercise per week.  4)Reduce stress - consider  counseling, meditation and relaxation to balance other aspects of your life.        

## 2017-12-26 ENCOUNTER — Other Ambulatory Visit: Payer: Self-pay | Admitting: Family Medicine

## 2018-03-05 ENCOUNTER — Other Ambulatory Visit: Payer: Self-pay | Admitting: Family Medicine

## 2018-04-14 IMAGING — CT CT RENAL STONE PROTOCOL
2 of 4 series · 16 of 46 positions shown, 18 images · non-contrast
Comparison: None.

CLINICAL DATA: Acute onset of right lower quadrant pain. Flank
pain.

EXAM:
CT ABDOMEN AND PELVIS WITHOUT CONTRAST
TECHNIQUE: Multidetector CT imaging of the abdomen and pelvis was performed
following the standard protocol without IV contrast.

[Series 3: renal stone 5.0 · axial · 0.73mm/px · z∈[-984,-544]mm · 13 of 96 slices shown, 15 images]
[im 4/96  soft-tissue]
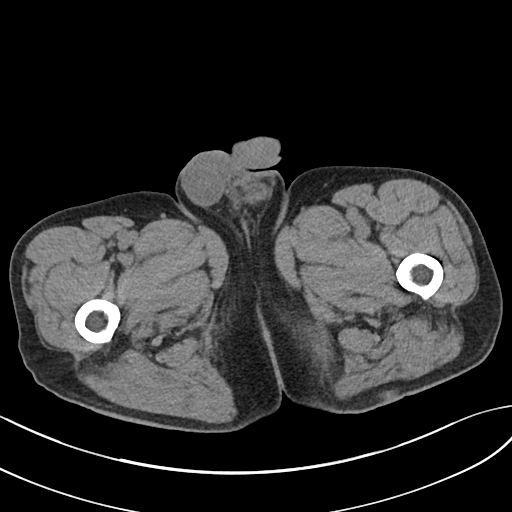
[im 4/96  bone]
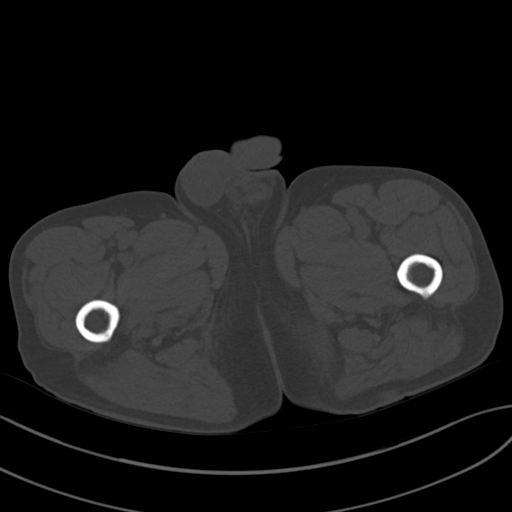
[im 11/96  soft-tissue]
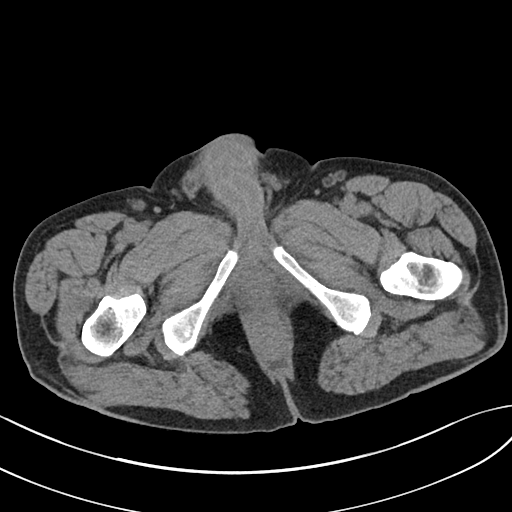
[im 19/96  soft-tissue]
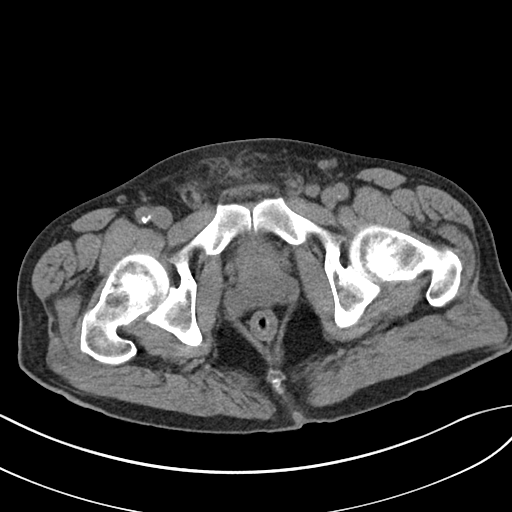
[im 26/96  soft-tissue]
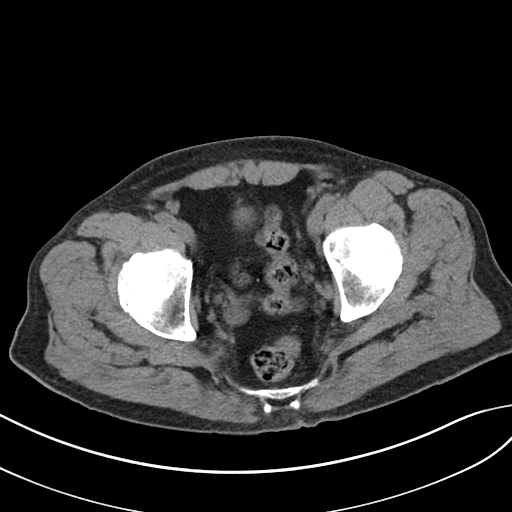
[im 33/96  soft-tissue]
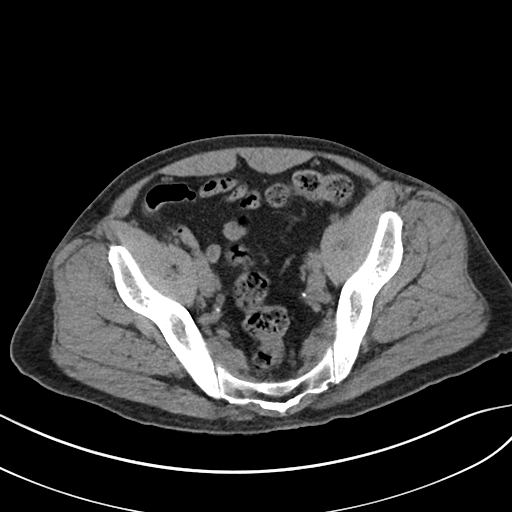
[im 41/96  soft-tissue]
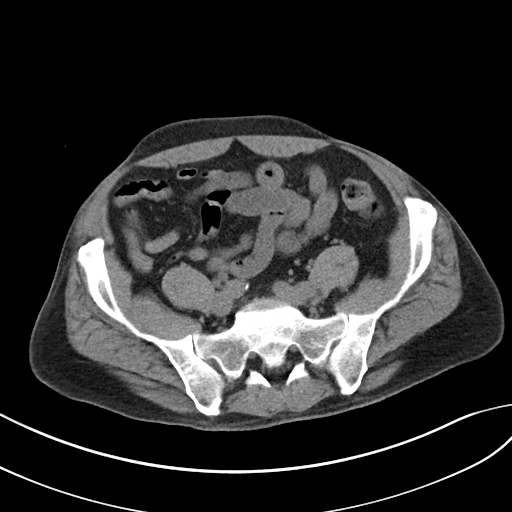
[im 48/96  soft-tissue]
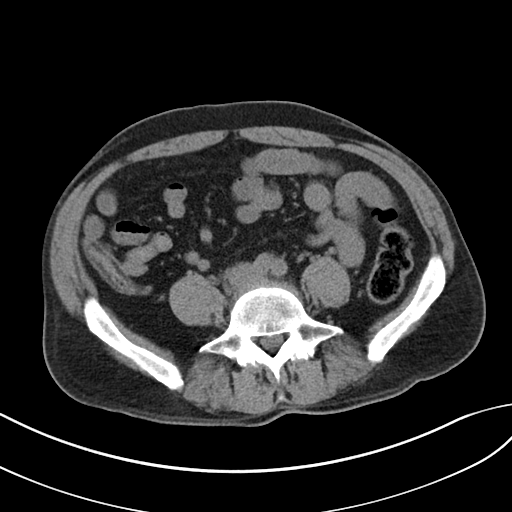
[im 55/96  soft-tissue]
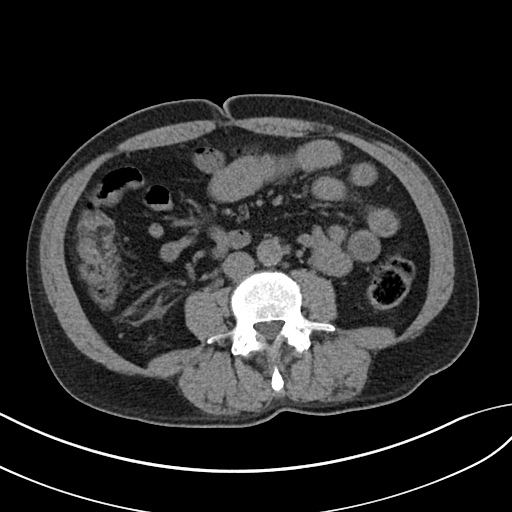
[im 63/96  soft-tissue]
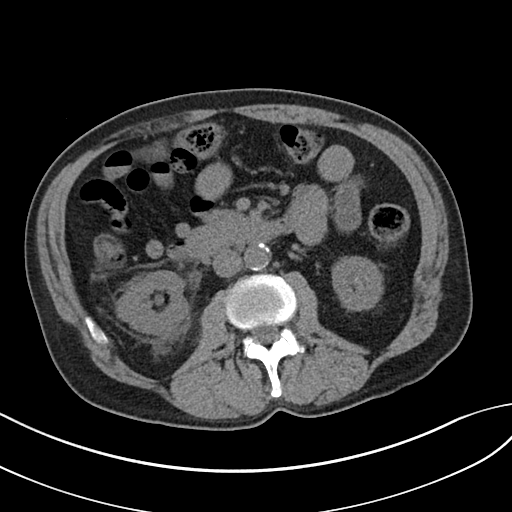
[im 63/96  bone]
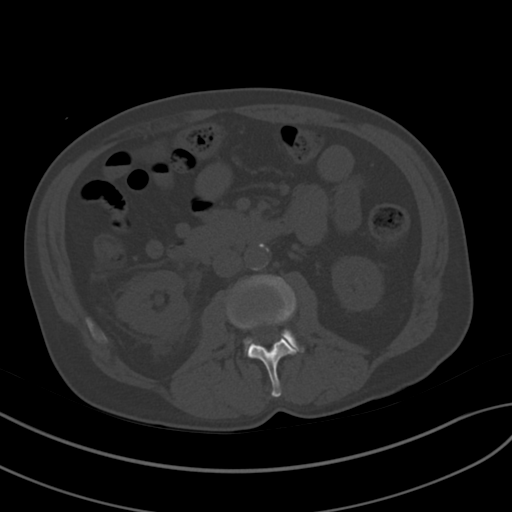
[im 70/96  soft-tissue]
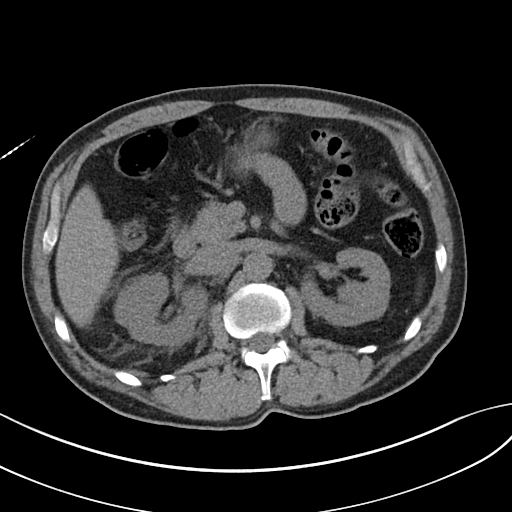
[im 77/96  soft-tissue]
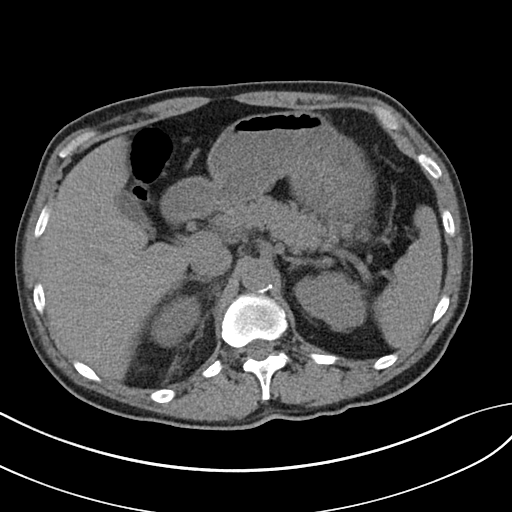
[im 85/96  soft-tissue]
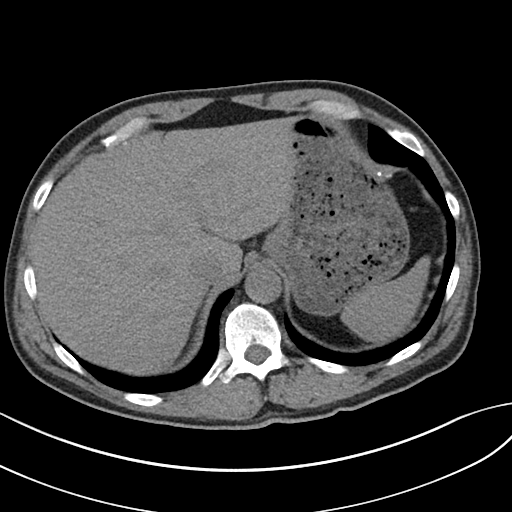
[im 92/96  soft-tissue]
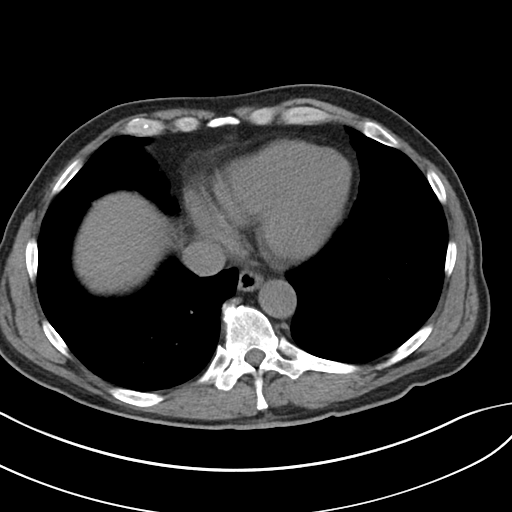

[Series 6: renal stone 3.0 cor · coronal · 0.78mm/px · 3 of 90 slices shown]
[im 30/90  soft-tissue]
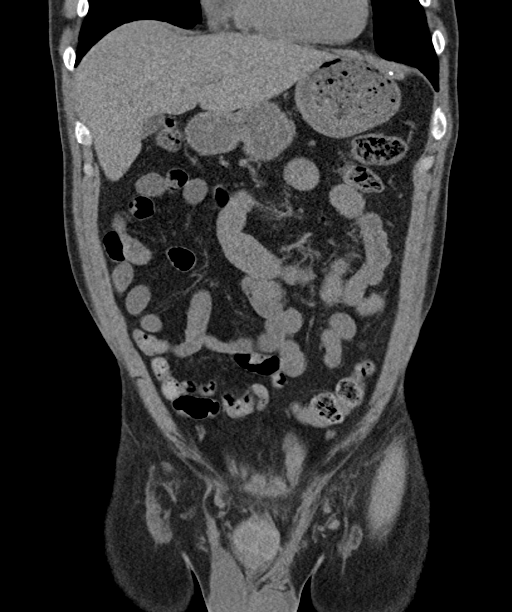
[im 40/90  soft-tissue]
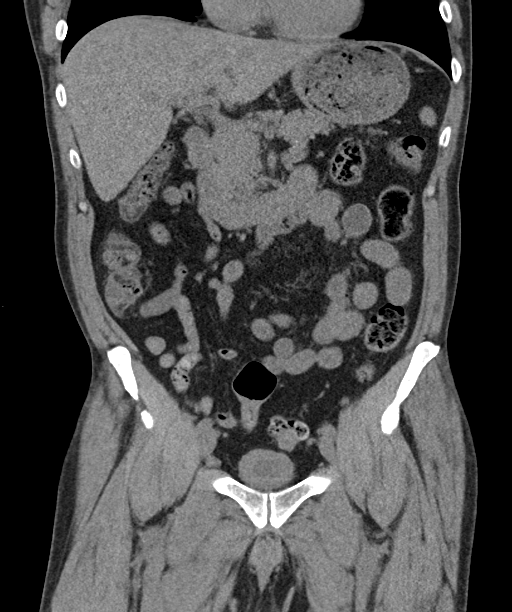
[im 50/90  soft-tissue]
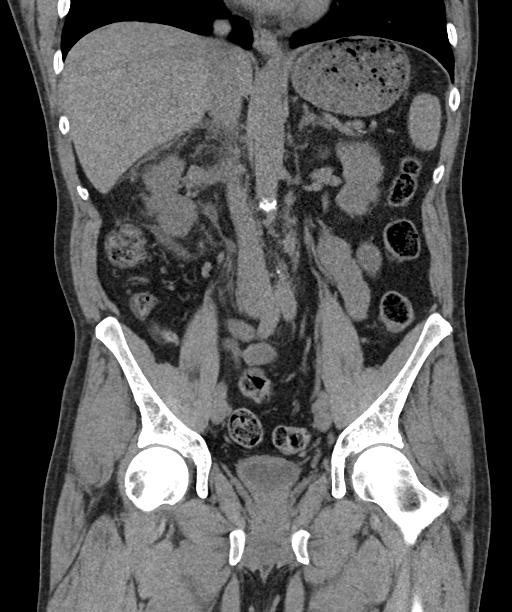

[16 of 46 positions shown; findings below may reference images not displayed]

FINDINGS: Lower chest: The lung bases are clear without focal nodule, mass, or
airspace disease. The heart size is normal. No significant pleural
or pericardial effusion is present.

Hepatobiliary: No focal liver abnormality is seen. No gallstones,
gallbladder wall thickening, or biliary dilatation.

Pancreas: Unremarkable. No pancreatic ductal dilatation or
surrounding inflammatory changes.

Spleen: Normal in size without focal abnormality.

Adrenals/Urinary Tract: Adrenal glands are normal bilaterally. There
is diffuse stranding about the right kidney with moderate
right-sided hydronephrosis. Right ureter is dilated to the level of
the UVJ where a 4.5 mm obstructing stone is present. 2 additional
punctate nonobstructing stones are present in the right kidney, 1 at
the lower pole and 1 at the upper pole.

The left kidney is unremarkable. There are no stones. The ureter is
within normal limits. The urinary bladder is unremarkable.

Stomach/Bowel: The stomach and duodenum are within normal limits.
The small bowel is unremarkable. The terminal ileum is within normal
limits. The appendix is normal. The ascending and transverse colon
are unremarkable. The descending and sigmoid colon are within normal
limits as well.

Vascular/Lymphatic: Vascular calcifications are present in the aorta
and branch vessels without aneurysm. No significant adenopathy is
present.

Reproductive: Prostate is unremarkable.

Other: No abdominal wall hernia or abnormality. No abdominopelvic
ascites.

Musculoskeletal: Grade 1 degenerative anterolisthesis is present at
L4-5. Mild leftward curvature is also centered at L4-5. Vertebral
heights are maintained. No focal lytic or blastic lesions are
present. The bony pelvis is intact. Hips are located and within
normal limits bilaterally.
IMPRESSION: 1. Obstructing 4.5 mm distal right ureteral stone at the UVJ with
moderate right-sided hydroureteronephrosis.
2. Two additional punctate nonobstructing stones within the right
kidney.
3. No significant left-sided stent.
4. Degenerative grade 1 anterolisthesis secondary to facet disease
at L4-5.

## 2018-04-29 DIAGNOSIS — M109 Gout, unspecified: Secondary | ICD-10-CM | POA: Diagnosis not present

## 2018-04-29 DIAGNOSIS — M1990 Unspecified osteoarthritis, unspecified site: Secondary | ICD-10-CM | POA: Diagnosis not present

## 2018-04-29 DIAGNOSIS — Z79899 Other long term (current) drug therapy: Secondary | ICD-10-CM | POA: Diagnosis not present

## 2018-05-11 NOTE — Progress Notes (Addendum)
HPI:  Using dictation device. Unfortunately this device frequently misinterprets words/phrases.  Adam Oliver is a pleasant 62 y.o. here for follow up. Chronic medical problems summarized below were reviewed for changes and stability and were updated as needed below. These issues and their treatment remain stable for the most part.  URI for 4-5 days. Symptoms include nasal congestion, pnd and cough. Denies fevers, malaise, SOB, body aches, wheezing, facial pain or ear pain, NV. Tried nyquil, musinex. Wife with similar. Rheumatologist requested cmp and uric acid for the gout dx when drawing labs - he brings orders for these. Advised to do colon cancer screening several times, ordered cologuard per his preference. Had not completed last visit and advised to do so. Reports he will complete it this week. Denies CP, SOB, DOE, treatment intolerance or new symptoms. Due for colon cancer screening, flu vaccine, labs  HTN: -meds: telmisartan-hctz in the past; stopped hctz due to gout and urinary frequency 2019  Renal insufficiency: -improved significantly with stopping NSAIDs and supplements -occ nsaids, but not on regular basis  HLD/Hyperglycemia: -meds: atorvastatin -stable  Hypothyroidism: -meds: levothyroxine -stable  Nocturia/urinary incontinence: -seen by urology in the past -referred again in 2019 - resolved off hctz -hx hydronephrosis, nephrolithiasis  Gout: -sees rheumatologist -meds: allopurinol  Reported history of possible MS: -Denies any change or symptoms from this at this time -Has some chronic mild bladder emptying issues, status post evaluation by urology in the past and was told this could be related to his MS -Per his report, he was managed by a neurologist in the past was on Betaseron briefly,then prior PCP told him his cholesterol medicine could treat this and he decided moving forwardthat this is all he will use and has not seen a neurologist  since   ROS: See pertinent positives and negatives per HPI.  Past Medical History:  Diagnosis Date  . Gout   . Hyperlipidemia   . Hypertension   . Hypothyroidism   . Multiple sclerosis ([place])     History reviewed. No pertinent surgical history.  Family History  Problem Relation Age of Onset  . Pancreatic cancer Mother     SOCIAL HX: see hpi   Current Outpatient Medications:  .  allopurinol (ZYLOPRIM) 100 MG tablet, 1 TABLET ONCE A DAY X 2 WEEKS THEN TWICE A DAY ORALLY 30 DAY(S), Disp: , Rfl: 0 .  atorvastatin (LIPITOR) 20 MG tablet, TAKE 1 TABLET BY MOUTH  DAILY AT 6:00PM., Disp: 90 tablet, Rfl: 1 .  levothyroxine (SYNTHROID, LEVOTHROID) 50 MCG tablet, TAKE 1 TABLET BY MOUTH 30  MINUTES BEFORE BREAKFAST, Disp: 90 tablet, Rfl: 1 .  telmisartan (MICARDIS) 40 MG tablet, Take 0.5 tablets (20 mg total) by mouth daily., Disp: 45 tablet, Rfl: 3 .  benzonatate (TESSALON PERLES) 100 MG capsule, Take 1 capsule (100 mg total) by mouth 3 (three) times daily as needed., Disp: 20 capsule, Rfl: 0  EXAM:  Vitals:   05/13/18 0851  BP: 120/88  Pulse: 96  Temp: 98.5 F (36.9 C)  SpO2: 96%    Body mass index is 25.04 kg/m.  GENERAL: vitals reviewed and listed above, alert, oriented, appears well hydrated and in no acute distress  HEENT: atraumatic, conjunttiva clear, no obvious abnormalities on inspection of external nose and ears, normal appearance of ear canals and TMs, clear nasal congestion, mild post oropharyngeal erythema with PND, no tonsillar edema or exudate, no sinus TTP  NECK: no obvious masses on inspection  LUNGS: clear to auscultation bilaterally, no  wheezes, rales or rhonchi, good air movement  CV: HRRR, no peripheral edema  MS: moves all extremities without noticeable abnormality  PSYCH: pleasant and cooperative, no obvious depression or anxiety  ASSESSMENT AND PLAN:  Discussed the following assessment and plan:  Essential hypertension - Plan: CBC  Other  specified hypothyroidism - Plan: TSH  Hyperlipidemia, unspecified hyperlipidemia type - Plan: Lipid panel  Hyperglycemia  Viral upper respiratory illness  Gout, unspecified cause, unspecified chronicity, unspecified site - Plan: Uric Acid, Comprehensive metabolic panel  Multiple sclerosis ([place]), Chronic  --we discussed possible serious and likely etiologies, workup and treatment, treatment risks and return precautions for current upper resp symptoms - suspect vuri -after this discussion, [name] opted for care per handout, tessalon -of course, we advised Adam Oliver  to return or notify a doctor immediately if symptoms worsen or persist or new concerns arise. -labs per order - forward to rheumatology -lifestyle recs -flu shot -advised to complete cologuard - advised assistant to follow up with him in 1 week to ensure done   Patient Instructions  BEFORE YOU LEAVE: -flu shot -labs -yearly phq9 -reminder to call him in 1 week about the cologuard test -follow up: 3-4 months for CPE if do or follow up  Complete the colon cancer screening test asap  We have ordered labs or studies at this visit. It can take up to 1-2 weeks for results and processing. IF results require follow up or explanation, we will call you with instructions. Clinically stable results will be released to your [facility]. If you have not heard from [place] or cannot find your results in [facility] in 2 weeks please contact our office at [phone].  If you are not yet signed up for [facility], please consider signing up.   INSTRUCTIONS FOR UPPER RESPIRATORY INFECTION:  -plenty of rest and fluids  -nasal saline wash 2-3 times daily (use prepackaged nasal saline or bottled/distilled water if making your own)   -can use AFRIN nasal spray for drainage and nasal congestion - but do NOT use longer then 3-4 days  -can use tylenol (in no history of liver disease) or ibuprofen (if no history of kidney disease, bowel bleeding or  significant heart disease) as directed for aches and sorethroat  -in the winter time, using a humidifier at night is helpful (please follow cleaning instructions)  -if you are taking a cough medication - use only as directed, may also try a teaspoon of honey to coat the throat and throat lozenges. I sent the Tessalon to the pharmacy.  -for sore throat, salt water gargles can help  -follow up if you have fevers, facial pain, tooth pain, difficulty breathing or are worsening or symptoms persist longer then expected  Upper Respiratory Infection, Adult An upper respiratory infection (URI) is also known as the common cold. It is often caused by a type of germ (virus). Colds are easily spread (contagious). You can pass it to others by kissing, coughing, sneezing, or drinking out of the same glass. Usually, you get better in 1 to 3  weeks.  However, the cough can last for even longer. HOME CARE   Only take medicine as told by your doctor. Follow instructions provided above.  Drink enough water and fluids to keep your pee (urine) clear or pale yellow.  Get plenty of rest.  Return to work when your temperature is < 100 for 24 hours or as told by your doctor. You may use a face mask and wash your hands to stop your  cold from spreading. GET HELP RIGHT AWAY IF:   After the first few days, you feel you are getting worse.  You have questions about your medicine.  You have chills, shortness of breath, or red spit (mucus).  You have pain in the face for more then 1-2 days, especially when you bend forward.  You have a fever, puffy (swollen) neck, pain when you swallow, or white spots in the back of your throat.  You have a bad headache, ear pain, sinus pain, or chest pain.  You have a high-pitched whistling sound when you breathe in and out (wheezing).  You cough up blood.  You have sore muscles or a stiff neck. MAKE SURE YOU:   Understand these instructions.  Will watch your  condition.  Will get help right away if you are not doing well or get worse. Document Released: 11/21/2007 Document Revised: 08/27/2011 Document Reviewed: 09/09/2013 [facility] Patient Information 2015 [place], [place]. This information is not intended to replace advice given to you by your health care provider. Make sure you discuss any questions you have with your health care provider.          Lucretia Kern, DO

## 2018-05-13 ENCOUNTER — Encounter: Payer: Self-pay | Admitting: Family Medicine

## 2018-05-13 ENCOUNTER — Ambulatory Visit: Payer: 59 | Admitting: Family Medicine

## 2018-05-13 VITALS — BP 120/88 | HR 96 | Temp 98.5°F | Ht 69.25 in | Wt 170.8 lb

## 2018-05-13 DIAGNOSIS — R739 Hyperglycemia, unspecified: Secondary | ICD-10-CM

## 2018-05-13 DIAGNOSIS — J069 Acute upper respiratory infection, unspecified: Secondary | ICD-10-CM

## 2018-05-13 DIAGNOSIS — G35 Multiple sclerosis: Secondary | ICD-10-CM

## 2018-05-13 DIAGNOSIS — M109 Gout, unspecified: Secondary | ICD-10-CM | POA: Diagnosis not present

## 2018-05-13 DIAGNOSIS — I10 Essential (primary) hypertension: Secondary | ICD-10-CM

## 2018-05-13 DIAGNOSIS — Z23 Encounter for immunization: Secondary | ICD-10-CM | POA: Diagnosis not present

## 2018-05-13 DIAGNOSIS — E038 Other specified hypothyroidism: Secondary | ICD-10-CM

## 2018-05-13 DIAGNOSIS — E785 Hyperlipidemia, unspecified: Secondary | ICD-10-CM

## 2018-05-13 LAB — COMPREHENSIVE METABOLIC PANEL
ALBUMIN: 4.2 g/dL (ref 3.5–5.2)
ALK PHOS: 70 U/L (ref 39–117)
ALT: 17 U/L (ref 0–53)
AST: 16 U/L (ref 0–37)
BUN: 14 mg/dL (ref 6–23)
CALCIUM: 9.1 mg/dL (ref 8.4–10.5)
CO2: 29 mEq/L (ref 19–32)
Chloride: 103 mEq/L (ref 96–112)
Creatinine, Ser: 1.38 mg/dL (ref 0.40–1.50)
GFR: 55.05 mL/min — AB (ref >60.00)
Glucose, Bld: 108 mg/dL — ABNORMAL HIGH (ref 70–99)
POTASSIUM: 4.7 meq/L (ref 3.5–5.1)
Sodium: 140 mEq/L (ref 135–145)
Total Bilirubin: 1.5 mg/dL — ABNORMAL HIGH (ref 0.2–1.2)
Total Protein: 6.8 g/dL (ref 6.0–8.3)

## 2018-05-13 LAB — LIPID PANEL
Cholesterol: 135 mg/dL (ref 0–200)
HDL: 43.9 mg/dL (ref >39.00)
LDL Cholesterol: 73 mg/dL (ref 0–99)
NonHDL: 90.68
Total CHOL/HDL Ratio: 3
Triglycerides: 89 mg/dL (ref 0.0–149.0)
VLDL: 17.8 mg/dL (ref 0.0–40.0)

## 2018-05-13 LAB — TSH: TSH: 2.11 u[IU]/mL (ref 0.35–4.50)

## 2018-05-13 LAB — CBC
HEMATOCRIT: 44.3 % (ref 39.0–52.0)
Hemoglobin: 14.8 g/dL (ref 13.0–17.0)
MCHC: 33.4 g/dL (ref 30.0–36.0)
MCV: 91.5 fl (ref 78.0–100.0)
Platelets: 214 10*3/uL (ref 150.0–400.0)
RBC: 4.85 Mil/uL (ref 4.22–5.81)
RDW: 13.8 % (ref 11.5–15.5)
WBC: 6.9 10*3/uL (ref 4.0–10.5)

## 2018-05-13 LAB — URIC ACID: Uric Acid, Serum: 5.3 mg/dL (ref 4.0–7.8)

## 2018-05-13 MED ORDER — BENZONATATE 100 MG PO CAPS: 100.0000 mg | ORAL_CAPSULE | Freq: Three times a day (TID) | ORAL | 0 refills | Status: DC | PRN

## 2018-05-13 NOTE — Patient Instructions (Addendum)
BEFORE YOU LEAVE: -flu shot -labs -yearly phq9 -reminder to call him in 1 week about the cologuard test -follow up: 3-4 months for CPE if do or follow up  Complete the colon cancer screening test asap  We have ordered labs or studies at this visit. It can take up to 1-2 weeks for results and processing. IF results require follow up or explanation, we will call you with instructions. Clinically stable results will be released to your [facility]. If you have not heard from [place] or cannot find your results in [facility] in 2 weeks please contact our office at [phone].  If you are not yet signed up for [facility], please consider signing up.   INSTRUCTIONS FOR UPPER RESPIRATORY INFECTION:  -plenty of rest and fluids  -nasal saline wash 2-3 times daily (use prepackaged nasal saline or bottled/distilled water if making your own)   -can use AFRIN nasal spray for drainage and nasal congestion - but do NOT use longer then 3-4 days  -can use tylenol (in no history of liver disease) or ibuprofen (if no history of kidney disease, bowel bleeding or significant heart disease) as directed for aches and sorethroat  -in the winter time, using a humidifier at night is helpful (please follow cleaning instructions)  -if you are taking a cough medication - use only as directed, may also try a teaspoon of honey to coat the throat and throat lozenges. I sent the Tessalon to the pharmacy.  -for sore throat, salt water gargles can help  -follow up if you have fevers, facial pain, tooth pain, difficulty breathing or are worsening or symptoms persist longer then expected  Upper Respiratory Infection, Adult An upper respiratory infection (URI) is also known as the common cold. It is often caused by a type of germ (virus). Colds are easily spread (contagious). You can pass it to others by kissing, coughing, sneezing, or drinking out of the same glass. Usually, you get better in 1 to 3  weeks.  However, the cough can  last for even longer. HOME CARE   Only take medicine as told by your doctor. Follow instructions provided above.  Drink enough water and fluids to keep your pee (urine) clear or pale yellow.  Get plenty of rest.  Return to work when your temperature is < 100 for 24 hours or as told by your doctor. You may use a face mask and wash your hands to stop your cold from spreading. GET HELP RIGHT AWAY IF:   After the first few days, you feel you are getting worse.  You have questions about your medicine.  You have chills, shortness of breath, or red spit (mucus).  You have pain in the face for more then 1-2 days, especially when you bend forward.  You have a fever, puffy (swollen) neck, pain when you swallow, or white spots in the back of your throat.  You have a bad headache, ear pain, sinus pain, or chest pain.  You have a high-pitched whistling sound when you breathe in and out (wheezing).  You cough up blood.  You have sore muscles or a stiff neck. MAKE SURE YOU:   Understand these instructions.  Will watch your condition.  Will get help right away if you are not doing well or get worse. Document Released: 11/21/2007 Document Revised: 08/27/2011 Document Reviewed: 09/09/2013 [facility] Patient Information 2015 [place], [place]. This information is not intended to replace advice given to you by your health care provider. Make sure you discuss any  questions you have with your health care provider.

## 2018-05-13 NOTE — Addendum Note (Signed)
Addended by: Agnes Lawrence on: 05/13/2018 09:19 AM   Modules accepted: Orders

## 2018-05-16 ENCOUNTER — Other Ambulatory Visit: Payer: Self-pay | Admitting: Family Medicine

## 2018-07-04 ENCOUNTER — Other Ambulatory Visit: Payer: Self-pay

## 2018-07-04 ENCOUNTER — Emergency Department (HOSPITAL_COMMUNITY)
Admission: EM | Admit: 2018-07-04 | Discharge: 2018-07-05 | Disposition: A | Discharge status: Home / Self Care | Payer: 59 | Attending: Emergency Medicine | Admitting: Emergency Medicine

## 2018-07-04 ENCOUNTER — Other Ambulatory Visit: Payer: Self-pay | Admitting: Family Medicine

## 2018-07-04 ENCOUNTER — Encounter (HOSPITAL_COMMUNITY): Payer: Self-pay | Admitting: Emergency Medicine

## 2018-07-04 ENCOUNTER — Emergency Department (HOSPITAL_COMMUNITY): Payer: 59

## 2018-07-04 DIAGNOSIS — R42 Dizziness and giddiness: Secondary | ICD-10-CM | POA: Diagnosis not present

## 2018-07-04 DIAGNOSIS — R55 Syncope and collapse: Secondary | ICD-10-CM | POA: Insufficient documentation

## 2018-07-04 DIAGNOSIS — I10 Essential (primary) hypertension: Secondary | ICD-10-CM | POA: Diagnosis not present

## 2018-07-04 DIAGNOSIS — R05 Cough: Secondary | ICD-10-CM | POA: Diagnosis not present

## 2018-07-04 DIAGNOSIS — E039 Hypothyroidism, unspecified: Secondary | ICD-10-CM | POA: Diagnosis not present

## 2018-07-04 LAB — CBC WITH DIFFERENTIAL/PLATELET
Abs Immature Granulocytes: 0.13 10*3/uL — ABNORMAL HIGH (ref 0.00–0.07)
Basophils Absolute: 0.1 10*3/uL (ref 0.0–0.1)
Basophils Relative: 0 %
EOS ABS: 0.2 10*3/uL (ref 0.0–0.5)
Eosinophils Relative: 1 %
HCT: 43.6 % (ref 39.0–52.0)
Hemoglobin: 13.8 g/dL (ref 13.0–17.0)
Immature Granulocytes: 1 %
Lymphocytes Relative: 8 %
Lymphs Abs: 1.4 10*3/uL (ref 0.7–4.0)
MCH: 29.4 pg (ref 26.0–34.0)
MCHC: 31.7 g/dL (ref 30.0–36.0)
MCV: 92.8 fL (ref 80.0–100.0)
Monocytes Absolute: 1.1 10*3/uL — ABNORMAL HIGH (ref 0.1–1.0)
Monocytes Relative: 6 %
Neutro Abs: 14.4 10*3/uL — ABNORMAL HIGH (ref 1.7–7.7)
Neutrophils Relative %: 84 %
Platelets: 271 10*3/uL (ref 150–400)
RBC: 4.7 MIL/uL (ref 4.22–5.81)
RDW: 13.1 % (ref 11.5–15.5)
WBC: 17.3 10*3/uL — ABNORMAL HIGH (ref 4.0–10.5)
nRBC: 0 % (ref 0.0–0.2)

## 2018-07-04 LAB — COMPREHENSIVE METABOLIC PANEL
ALK PHOS: 67 U/L (ref 38–126)
ALT: 38 U/L (ref 0–44)
AST: 22 U/L (ref 15–41)
Albumin: 3.3 g/dL — ABNORMAL LOW (ref 3.5–5.0)
Anion gap: 11 (ref 5–15)
BILIRUBIN TOTAL: 1.5 mg/dL — AB (ref 0.3–1.2)
BUN: 23 mg/dL (ref 8–23)
CALCIUM: 8.4 mg/dL — AB (ref 8.9–10.3)
CO2: 23 mmol/L (ref 22–32)
Chloride: 105 mmol/L (ref 98–111)
Creatinine, Ser: 1.59 mg/dL — ABNORMAL HIGH (ref 0.61–1.24)
GFR calc Af Amer: 52 mL/min — ABNORMAL LOW (ref >60)
GFR calc non Af Amer: 45 mL/min — ABNORMAL LOW (ref >60)
Glucose, Bld: 104 mg/dL — ABNORMAL HIGH (ref 70–99)
Potassium: 4.2 mmol/L (ref 3.5–5.1)
Sodium: 139 mmol/L (ref 135–145)
Total Protein: 6.1 g/dL — ABNORMAL LOW (ref 6.5–8.1)

## 2018-07-04 LAB — TROPONIN I: Troponin I: <0.03 ng/mL (ref <0.03)

## 2018-07-04 LAB — D-DIMER, QUANTITATIVE: D-Dimer, Quant: 0.32 ug/mL-FEU (ref 0.00–0.50)

## 2018-07-04 MED ORDER — SODIUM CHLORIDE 0.9% FLUSH: 3.0000 mL | Freq: Once | INTRAVENOUS | Status: DC

## 2018-07-04 MED ORDER — SODIUM CHLORIDE 0.9 % IV BOLUS
1000.0000 mL | Freq: Once | INTRAVENOUS | Status: AC
  Administered 2018-07-04: 1000 mL via INTRAVENOUS

## 2018-07-04 NOTE — ED Triage Notes (Signed)
Arrived via EMS patient had 2 syncope events one lasting 10 seconds the second 15 seconds. EMS administered 0.9 NS 145ml prior to arrival. Patient alert answering and following commands appropriate. Has no complaints at this time.

## 2018-07-04 NOTE — ED Notes (Signed)
Doctor at bedside.

## 2018-07-04 NOTE — ED Provider Notes (Signed)
Emergency Department Provider Note   I have reviewed the triage vital signs and the nursing notes.   HISTORY  Chief Complaint Loss of Consciousness   HPI Adam Oliver is a 63 y.o. male with PMH of HLD, HTN, Hypothyroidism, and MS presents to the emergency department for evaluation of syncope.  Patient had 2 episodes of syncope while [place] drinks [place] the [place] and visiting with friends.  Patient states he had not been well-hydrated throughout the day.  He was in [place] early this morning and had 2 cups of coffee.  He did not drink much fluid during the drive here.  He had one beer at the club.  Patient's wife is at bedside and provides some of the history.  Patient states that he felt warm and lightheaded.  He went to sit down to try and gather himself but proceeded to have a syncope event while seated.  He did not fall to the ground.  Wife estimates that he lost consciousness for approximately 15 seconds.  She did notice some mild shaking of the right arm.  Patient awoke and was immediately back to normal mental status.  He wanted to get up and go but began to feel lightheaded again.  He was laid on the ground where he had a second syncope event without any shaking activity.  After approximately 15 seconds he again awoke as if nothing had happened.  He denies experiencing any chest pain/tightness, palpitations, or shortness of breath.  No changes to medications.  He is feeling okay now.  EMS was called and transported the patient with IV fluids.   Past Medical History:  Diagnosis Date  . Gout   . Hyperlipidemia   . Hypertension   . Hypothyroidism   . Multiple sclerosis [facility])     Patient Active Problem List   Diagnosis Date Noted  . Nephrolithiasis 07/16/2017  . Urinary incontinence 07/16/2017  . Hyperglycemia 07/16/2017  . Hypothyroidism 09/16/2007  . Essential hypertension 07/17/2007  . Hyperlipidemia 04/10/2007  . Gout 04/10/2007  . MULTIPLE SCLEROSIS 04/02/2007      History reviewed. No pertinent surgical history.  Allergies Patient has no known allergies.  Family History  Problem Relation Age of Onset  . Pancreatic cancer Mother     Social History Social History   Tobacco Use  . Smoking status: Never Smoker  . Smokeless tobacco: Never Used  Substance Use Topics  . Alcohol use: Yes    Comment: every other day - 2 drinks  . Drug use: No    Review of Systems  Constitutional: No fever/chills Eyes: No visual changes. ENT: No sore throat. Cardiovascular: Denies chest pain. Positive syncope x 2.  Respiratory: Denies shortness of breath. Gastrointestinal: No abdominal pain.  No nausea, no vomiting.  No diarrhea.  No constipation. Genitourinary: Negative for dysuria. Musculoskeletal: Negative for back pain. Skin: Negative for rash. Neurological: Negative for headaches, focal weakness or numbness.  10-point ROS otherwise negative.  ____________________________________________   PHYSICAL EXAM:  VITAL SIGNS: ED Triage Vitals [07/04/18 2038]  Enc Vitals Group     BP 111/86     Pulse Rate 77     Resp 13     Temp (!) 97.4 F (36.3 C)     Temp Source Oral     SpO2 99 %     Weight 160 lb (72.6 kg)     Height 5\' 10"  (1.778 m)     Pain Score 0   Constitutional: Alert and oriented.  Well appearing and in no acute distress. Eyes: Conjunctivae are normal. PERRL.  Head: Atraumatic. Nose: No congestion/rhinnorhea. Mouth/Throat: Mucous membranes are moist.  Neck: No stridor.  Cardiovascular: Normal rate, regular rhythm. Good peripheral circulation. Grossly normal heart sounds.   Respiratory: Normal respiratory effort.  No retractions. Lungs CTAB. Gastrointestinal: Soft and nontender. No distention.  Musculoskeletal: No lower extremity tenderness nor edema. No gross deformities of extremities. Neurologic:  Normal speech and language. No gross focal neurologic deficits are appreciated. Normal CN exam 2-12. No pronator drift. Normal  finger-to-nose.  Skin:  Skin is warm, dry and intact. No rash noted.  ____________________________________________   LABS (all labs ordered are listed, but only abnormal results are displayed)  Labs Reviewed  COMPREHENSIVE METABOLIC PANEL - Abnormal; Notable for the following components:      Result Value   Glucose, Bld 104 (*)    Creatinine, Ser 1.59 (*)    Calcium 8.4 (*)    Total Protein 6.1 (*)    Albumin 3.3 (*)    Total Bilirubin 1.5 (*)    GFR calc non Af Amer 45 (*)    GFR calc Af Amer 52 (*)    All other components within normal limits  CBC WITH DIFFERENTIAL/PLATELET - Abnormal; Notable for the following components:   WBC 17.3 (*)    Neutro Abs 14.4 (*)    Monocytes Absolute 1.1 (*)    Abs Immature Granulocytes 0.13 (*)    All other components within normal limits  TROPONIN I  D-DIMER, QUANTITATIVE (NOT AT [facility])  TROPONIN I   ____________________________________________  EKG   EKG Interpretation  Date/Time:  Friday July 04 2018 20:36:04 EST Ventricular Rate:  77 PR Interval:    QRS Duration: 103 QT Interval:  387 QTC Calculation: 438 R Axis:   22 Text Interpretation:  Sinus rhythm RSR' in V1 or V2, probably normal variant No STEMI.  Confirmed by Nanda Quinton [phone]) on 07/04/2018 8:44:31 PM       ____________________________________________  RADIOLOGY  Dg Chest 2 View  Result Date: 07/04/2018 CLINICAL DATA:  63 year old male with syncope, diaphoresis. EXAM: CHEST - 2 VIEW COMPARISON:  CT Abdomen and Pelvis 06/18/2017. FINDINGS: Incidental bilateral nipple shadows. Normal cardiac size and mediastinal contours. Visualized tracheal air column is within normal limits. Low normal lung volumes. Both lungs appear clear. No pneumothorax or pleural effusion. No acute osseous abnormality identified. Negative visible bowel gas pattern. IMPRESSION: No cardiopulmonary abnormality. Electronically Signed   By: Genevie Ann M.D.   On: 07/04/2018 21:35     ____________________________________________   PROCEDURES  Procedure(s) performed:   Procedures  None ____________________________________________   INITIAL IMPRESSION / ASSESSMENT AND PLAN / ED COURSE  Pertinent labs & imaging results that were available during my care of the patient were reviewed by me and considered in my medical decision making (see chart for details).  Patient presents to the emergency department for evaluation of syncope x2.  Had a car trip today of several hours with some possible dehydration.  Patient reports only feeling warm and lightheaded.  No chest pain, shortness of breath, heart palpitations.  My suspicion for cardiogenic syncope is relatively low.  Seems most consistent with vasovagal syncope.  Plan for screening labs, chest x-ray, IV fluids, orthostatic vital signs.  Given his  car trip today I do plan to add on a d-dimer for possible atypical PE presentation.   Labs reviewed with no acute findings. Repeat troponin pending. Care transferred to Dr. Leonides Schanz. Patient feeling much  better after IVF. He is ambulatory without symptoms. Orthostatic vitals are positive with tachycardia on standing. No hypotension.  ____________________________________________  FINAL CLINICAL IMPRESSION(S) / ED DIAGNOSES  Final diagnoses:  Syncope and collapse     MEDICATIONS GIVEN DURING THIS VISIT:  Medications  sodium chloride 0.9 % bolus 1,000 mL (0 mLs Intravenous Stopped 07/05/18 0008)    Note:  This document was prepared using Dragon voice recognition software and may include unintentional dictation errors.  Nanda Quinton, MD Emergency Medicine    , [facility], MD 07/05/18 [id number]

## 2018-07-05 LAB — TROPONIN I: Troponin I: <0.03 ng/mL (ref <0.03)

## 2018-07-05 NOTE — Discharge Instructions (Addendum)

## 2018-07-05 NOTE — ED Notes (Signed)
Patient verbalizes understanding of discharge instructions. Opportunity for questioning and answers were provided. Armband removed by staff, pt discharged from ED.  

## 2018-07-10 DIAGNOSIS — M109 Gout, unspecified: Secondary | ICD-10-CM | POA: Diagnosis not present

## 2018-08-18 DIAGNOSIS — M25561 Pain in right knee: Secondary | ICD-10-CM | POA: Diagnosis not present

## 2018-08-22 ENCOUNTER — Other Ambulatory Visit: Payer: Self-pay | Admitting: Family Medicine

## 2018-10-09 DIAGNOSIS — M180 Bilateral primary osteoarthritis of first carpometacarpal joints: Secondary | ICD-10-CM | POA: Diagnosis not present

## 2018-11-02 ENCOUNTER — Other Ambulatory Visit: Payer: Self-pay | Admitting: Family Medicine

## 2018-12-11 ENCOUNTER — Encounter: Payer: 59 | Admitting: Family Medicine

## 2018-12-22 ENCOUNTER — Other Ambulatory Visit: Payer: Self-pay

## 2018-12-22 ENCOUNTER — Encounter: Payer: Self-pay | Admitting: Family Medicine

## 2018-12-22 ENCOUNTER — Ambulatory Visit (INDEPENDENT_AMBULATORY_CARE_PROVIDER_SITE_OTHER): Payer: 59 | Admitting: Family Medicine

## 2018-12-22 VITALS — BP 118/70 | HR 55 | Temp 97.5°F | Ht 70.0 in | Wt 165.3 lb

## 2018-12-22 DIAGNOSIS — R17 Unspecified jaundice: Secondary | ICD-10-CM

## 2018-12-22 DIAGNOSIS — E785 Hyperlipidemia, unspecified: Secondary | ICD-10-CM

## 2018-12-22 DIAGNOSIS — M109 Gout, unspecified: Secondary | ICD-10-CM | POA: Diagnosis not present

## 2018-12-22 DIAGNOSIS — R739 Hyperglycemia, unspecified: Secondary | ICD-10-CM

## 2018-12-22 DIAGNOSIS — E038 Other specified hypothyroidism: Secondary | ICD-10-CM | POA: Diagnosis not present

## 2018-12-22 DIAGNOSIS — G629 Polyneuropathy, unspecified: Secondary | ICD-10-CM

## 2018-12-22 DIAGNOSIS — Z23 Encounter for immunization: Secondary | ICD-10-CM | POA: Diagnosis not present

## 2018-12-22 DIAGNOSIS — I10 Essential (primary) hypertension: Secondary | ICD-10-CM | POA: Diagnosis not present

## 2018-12-22 DIAGNOSIS — G35 Multiple sclerosis: Secondary | ICD-10-CM | POA: Diagnosis not present

## 2018-12-22 DIAGNOSIS — E538 Deficiency of other specified B group vitamins: Secondary | ICD-10-CM

## 2018-12-22 LAB — CBC WITH DIFFERENTIAL/PLATELET
Basophils Absolute: 0.1 10*3/uL (ref 0.0–0.1)
Basophils Relative: 1.3 % (ref 0.0–3.0)
Eosinophils Absolute: 0.4 10*3/uL (ref 0.0–0.7)
Eosinophils Relative: 5.4 % — ABNORMAL HIGH (ref 0.0–5.0)
HCT: 45.8 % (ref 39.0–52.0)
Hemoglobin: 15.3 g/dL (ref 13.0–17.0)
Lymphocytes Relative: 28.2 % (ref 12.0–46.0)
Lymphs Abs: 1.9 10*3/uL (ref 0.7–4.0)
MCHC: 33.5 g/dL (ref 30.0–36.0)
MCV: 93 fl (ref 78.0–100.0)
Monocytes Absolute: 0.7 10*3/uL (ref 0.1–1.0)
Monocytes Relative: 9.7 % (ref 3.0–12.0)
Neutro Abs: 3.8 10*3/uL (ref 1.4–7.7)
Neutrophils Relative %: 55.4 % (ref 43.0–77.0)
Platelets: 236 10*3/uL (ref 150.0–400.0)
RBC: 4.92 Mil/uL (ref 4.22–5.81)
RDW: 13.7 % (ref 11.5–15.5)
WBC: 6.8 10*3/uL (ref 4.0–10.5)

## 2018-12-22 LAB — COMPREHENSIVE METABOLIC PANEL
ALT: 23 U/L (ref 0–53)
AST: 19 U/L (ref 0–37)
Albumin: 4.6 g/dL (ref 3.5–5.2)
Alkaline Phosphatase: 75 U/L (ref 39–117)
BUN: 24 mg/dL — ABNORMAL HIGH (ref 6–23)
CO2: 27 mEq/L (ref 19–32)
Calcium: 9.2 mg/dL (ref 8.4–10.5)
Chloride: 104 mEq/L (ref 96–112)
Creatinine, Ser: 1.27 mg/dL (ref 0.40–1.50)
GFR: 56.9 mL/min — ABNORMAL LOW (ref >60.00)
Glucose, Bld: 90 mg/dL (ref 70–99)
Potassium: 4.6 mEq/L (ref 3.5–5.1)
Sodium: 142 mEq/L (ref 135–145)
Total Bilirubin: 1.9 mg/dL — ABNORMAL HIGH (ref 0.2–1.2)
Total Protein: 7.1 g/dL (ref 6.0–8.3)

## 2018-12-22 LAB — LIPID PANEL
Cholesterol: 150 mg/dL (ref 0–200)
HDL: 46 mg/dL (ref >39.00)
LDL Cholesterol: 77 mg/dL (ref 0–99)
NonHDL: 104.18
Total CHOL/HDL Ratio: 3
Triglycerides: 136 mg/dL (ref 0.0–149.0)
VLDL: 27.2 mg/dL (ref 0.0–40.0)

## 2018-12-22 LAB — VITAMIN B12: Vitamin B-12: 190 pg/mL — ABNORMAL LOW (ref 211–911)

## 2018-12-22 LAB — URIC ACID: Uric Acid, Serum: 6.4 mg/dL (ref 4.0–7.8)

## 2018-12-22 LAB — TSH: TSH: 1.57 u[IU]/mL (ref 0.35–4.50)

## 2018-12-22 LAB — HEMOGLOBIN A1C: Hgb A1c MFr Bld: 5.9 % (ref 4.6–6.5)

## 2018-12-22 MED ORDER — TELMISARTAN 40 MG PO TABS: 20.0000 mg | ORAL_TABLET | Freq: Every day | ORAL | 1 refills | Status: DC

## 2018-12-22 NOTE — Progress Notes (Signed)
Adam Oliver DOB: 10/21/1955 Encounter date: 12/22/2018  This isa 63 y.o. male who presents to establish care. Chief Complaint  Patient presents with  . Establish Care   In ER Jan for syncope; last visit [place] was 04/2018. No symptoms since then.   History of present illness:  HTN: telmisartan 20mg  daily; doesn't check at home. No problems with medication.  OH:YWVPXTG 20mg  Hypothyroid:synthroid 51mcg  MS: diagnosed when he was in early 54's. Had one exacerbation early on when he was diagnosed - legs numbs at that initial time of dx. Still has some numbness in feet/hands. Took Betaseron for over 15 years and finally stopped this by their recommendation and he did well off of medication (has been off for at least 3 years now). Balance feels slightly off when he closes his eyes. Everything feels stable. For first few years was very symptomatic and kept him from doing regular activity.   Gout:allopurinol 100mg  BID. Doesn't remember last time he has had gout flare - not since before taking allopurinol regularly. Hyperglycemia:not eating anything specific or specific restrictions with diet. Does exercise regularly.   Past Medical History:  Diagnosis Date  . Gout   . Hyperlipidemia   . Hypertension   . Hypothyroidism   . Multiple sclerosis ([place])    Past Surgical History:  Procedure Laterality Date  . OTHER SURGICAL HISTORY     right thumb tendon surgery   No Known Allergies Current Meds  Medication Sig  . allopurinol (ZYLOPRIM) 100 MG tablet Take 100 mg by mouth 2 (two) times daily.   Marland Kitchen atorvastatin (LIPITOR) 20 MG tablet TAKE 1 TABLET BY MOUTH  DAILY AT 6:00PM.  . levothyroxine (SYNTHROID, LEVOTHROID) 50 MCG tablet TAKE 1 TABLET BY MOUTH 30  MINUTES BEFORE BREAKFAST  . telmisartan (MICARDIS) 40 MG tablet Take 0.5 tablets (20 mg total) by mouth daily.  . [DISCONTINUED] telmisartan (MICARDIS) 40 MG tablet TAKE 0.5 TABLETS (20 MG TOTAL) BY MOUTH DAILY.   Social History   Tobacco Use   . Smoking status: Never Smoker  . Smokeless tobacco: Never Used  Substance Use Topics  . Alcohol use: Yes    Comment: every other day - 2 drinks   Family History  Problem Relation Age of Onset  . Pancreatic cancer Mother   . Throat cancer Father   . Cancer Paternal Grandmother 32       unknown type  . Heart disease Paternal Grandfather   . Skin cancer Paternal Grandfather      Review of Systems  Constitutional: Negative for chills, fatigue and fever.  Respiratory: Negative for cough, chest tightness, shortness of breath and wheezing.   Cardiovascular: Negative for chest pain, palpitations and leg swelling.    Objective:  BP 118/70 (BP Location: Left Arm, Patient Position: Sitting, Cuff Size: Normal)   Pulse (!) 55   Temp (!) 97.5 F (36.4 C) (Oral)   Ht 5\' 10"  (1.778 m)   Wt 165 lb 4.8 oz (75 kg)   SpO2 98%   BMI 23.72 kg/m   Weight: 165 lb 4.8 oz (75 kg)   BP Readings from Last 3 Encounters:  12/22/18 118/70  07/05/18 (!) 132/95  05/13/18 120/88   Wt Readings from Last 3 Encounters:  12/22/18 165 lb 4.8 oz (75 kg)  07/04/18 160 lb (72.6 kg)  05/13/18 170 lb 12.8 oz (77.5 kg)    Physical Exam Constitutional:      General: He is not in acute distress.    Appearance: He  is well-developed.  Cardiovascular:     Rate and Rhythm: Normal rate and regular rhythm.     Heart sounds: Normal heart sounds. No murmur. No friction rub.  Pulmonary:     Effort: Pulmonary effort is normal. No respiratory distress.     Breath sounds: Normal breath sounds. No wheezing or rales.  Musculoskeletal:     Right lower leg: No edema.     Left lower leg: No edema.  Neurological:     Mental Status: He is alert and oriented to person, place, and time.  Psychiatric:        Behavior: Behavior normal.     Assessment/Plan:  1. Neuropathy - Vitamin B12; Future - Vitamin B12  2. Essential hypertension Controlled; continue current medications. - CBC with Differential/Platelet;  Future - Comprehensive metabolic panel; Future - telmisartan (MICARDIS) 40 MG tablet; Take 0.5 tablets (20 mg total) by mouth daily.  Dispense: 45 tablet; Refill: 1 - Comprehensive metabolic panel - CBC with Differential/Platelet  3. Other specified hypothyroidism Continue current synthroid dose. - TSH; Future - TSH  4. MULTIPLE SCLEROSIS Has been stable with regards to symptoms for years.  He declines referral for neurologist at this point since he does not feel that any specific treatment is required and symptoms have been so stable.  5. Hyperlipidemia, unspecified hyperlipidemia type Continue Lipitor. - Lipid panel; Future - Lipid panel  6. Gout, unspecified cause, unspecified chronicity, unspecified site Well-controlled on allopurinol.  Let me know medication that was prescribed for as needed use by Ortho.  He cannot recall name in the office today. - Uric acid; Future - Uric acid  7. Hyperglycemia - Hemoglobin A1c; Future - Hemoglobin A1c  8. Need for pneumococcal vaccination - Pneumococcal conjugate vaccine 13-valent  Return in about 6 months (around 06/24/2019) for physical exam.  Micheline Rough, MD

## 2018-12-24 ENCOUNTER — Other Ambulatory Visit: Payer: Self-pay | Admitting: Family Medicine

## 2019-01-21 NOTE — Addendum Note (Signed)
Addended by: Agnes Lawrence on: 01/21/2019 04:14 PM   Modules accepted: Orders

## 2019-02-08 ENCOUNTER — Other Ambulatory Visit: Payer: Self-pay | Admitting: Family Medicine

## 2019-02-24 ENCOUNTER — Telehealth: Payer: Self-pay | Admitting: Family Medicine

## 2019-02-24 ENCOUNTER — Other Ambulatory Visit (INDEPENDENT_AMBULATORY_CARE_PROVIDER_SITE_OTHER): Payer: Medicare Other

## 2019-02-24 ENCOUNTER — Other Ambulatory Visit: Payer: Self-pay

## 2019-02-24 DIAGNOSIS — E538 Deficiency of other specified B group vitamins: Secondary | ICD-10-CM

## 2019-02-24 DIAGNOSIS — R17 Unspecified jaundice: Secondary | ICD-10-CM

## 2019-02-24 LAB — COMPREHENSIVE METABOLIC PANEL
ALT: 21 U/L (ref 0–53)
AST: 18 U/L (ref 0–37)
Albumin: 4.2 g/dL (ref 3.5–5.2)
Alkaline Phosphatase: 68 U/L (ref 39–117)
BUN: 24 mg/dL — ABNORMAL HIGH (ref 6–23)
CO2: 26 mEq/L (ref 19–32)
Calcium: 9 mg/dL (ref 8.4–10.5)
Chloride: 108 mEq/L (ref 96–112)
Creatinine, Ser: 1.34 mg/dL (ref 0.40–1.50)
GFR: 53.45 mL/min — ABNORMAL LOW (ref >60.00)
Glucose, Bld: 114 mg/dL — ABNORMAL HIGH (ref 70–99)
Potassium: 4.4 mEq/L (ref 3.5–5.1)
Sodium: 142 mEq/L (ref 135–145)
Total Bilirubin: 1.5 mg/dL — ABNORMAL HIGH (ref 0.2–1.2)
Total Protein: 6.8 g/dL (ref 6.0–8.3)

## 2019-02-24 LAB — FOLATE: Folate: 13.2 ng/mL (ref >5.9)

## 2019-02-24 LAB — VITAMIN B12: Vitamin B-12: 1195 pg/mL — ABNORMAL HIGH (ref 211–911)

## 2019-02-24 NOTE — Telephone Encounter (Signed)
The patient is here for labs today and is wanting a PSA done with his labs today because his brother has prostate cancer and he was told to ask tho have that test done today while he is here.   Please advise

## 2019-02-25 NOTE — Telephone Encounter (Signed)
Per Jacquelynn Cree the test will be added to tests from 9/8.  I called the pt and informed him of this also.

## 2019-02-25 NOTE — Telephone Encounter (Signed)
This was sent yesterday and I wasn't in office. Can PSA be added with diagnosis of prostate cancer screening, family member with prostate cancer?

## 2019-02-26 ENCOUNTER — Other Ambulatory Visit (INDEPENDENT_AMBULATORY_CARE_PROVIDER_SITE_OTHER): Payer: Medicare Other

## 2019-02-26 DIAGNOSIS — Z125 Encounter for screening for malignant neoplasm of prostate: Secondary | ICD-10-CM

## 2019-02-26 LAB — PSA: PSA: 2.17 ng/mL (ref 0.10–4.00)

## 2019-04-08 ENCOUNTER — Telehealth: Payer: Self-pay | Admitting: Family Medicine

## 2019-04-08 NOTE — Telephone Encounter (Signed)
rx refill levothyroxine (SYNTHROID) 50 MCG tablet  [facility]Y [id number] [place] [place] [facility] [place] [phone] (Phone) [phone] (Fax)

## 2019-04-09 MED ORDER — LEVOTHYROXINE SODIUM 50 MCG PO TABS: ORAL_TABLET | ORAL | 1 refills | Status: DC

## 2019-04-30 IMAGING — DX DG CHEST 2V
2 series · 2 of 2 positions shown · non-contrast
Comparison: CT Abdomen and Pelvis 06/18/2017.

CLINICAL DATA: 62-year-old male with syncope, diaphoresis.

EXAM:
CHEST - 2 VIEW

[chest pa]
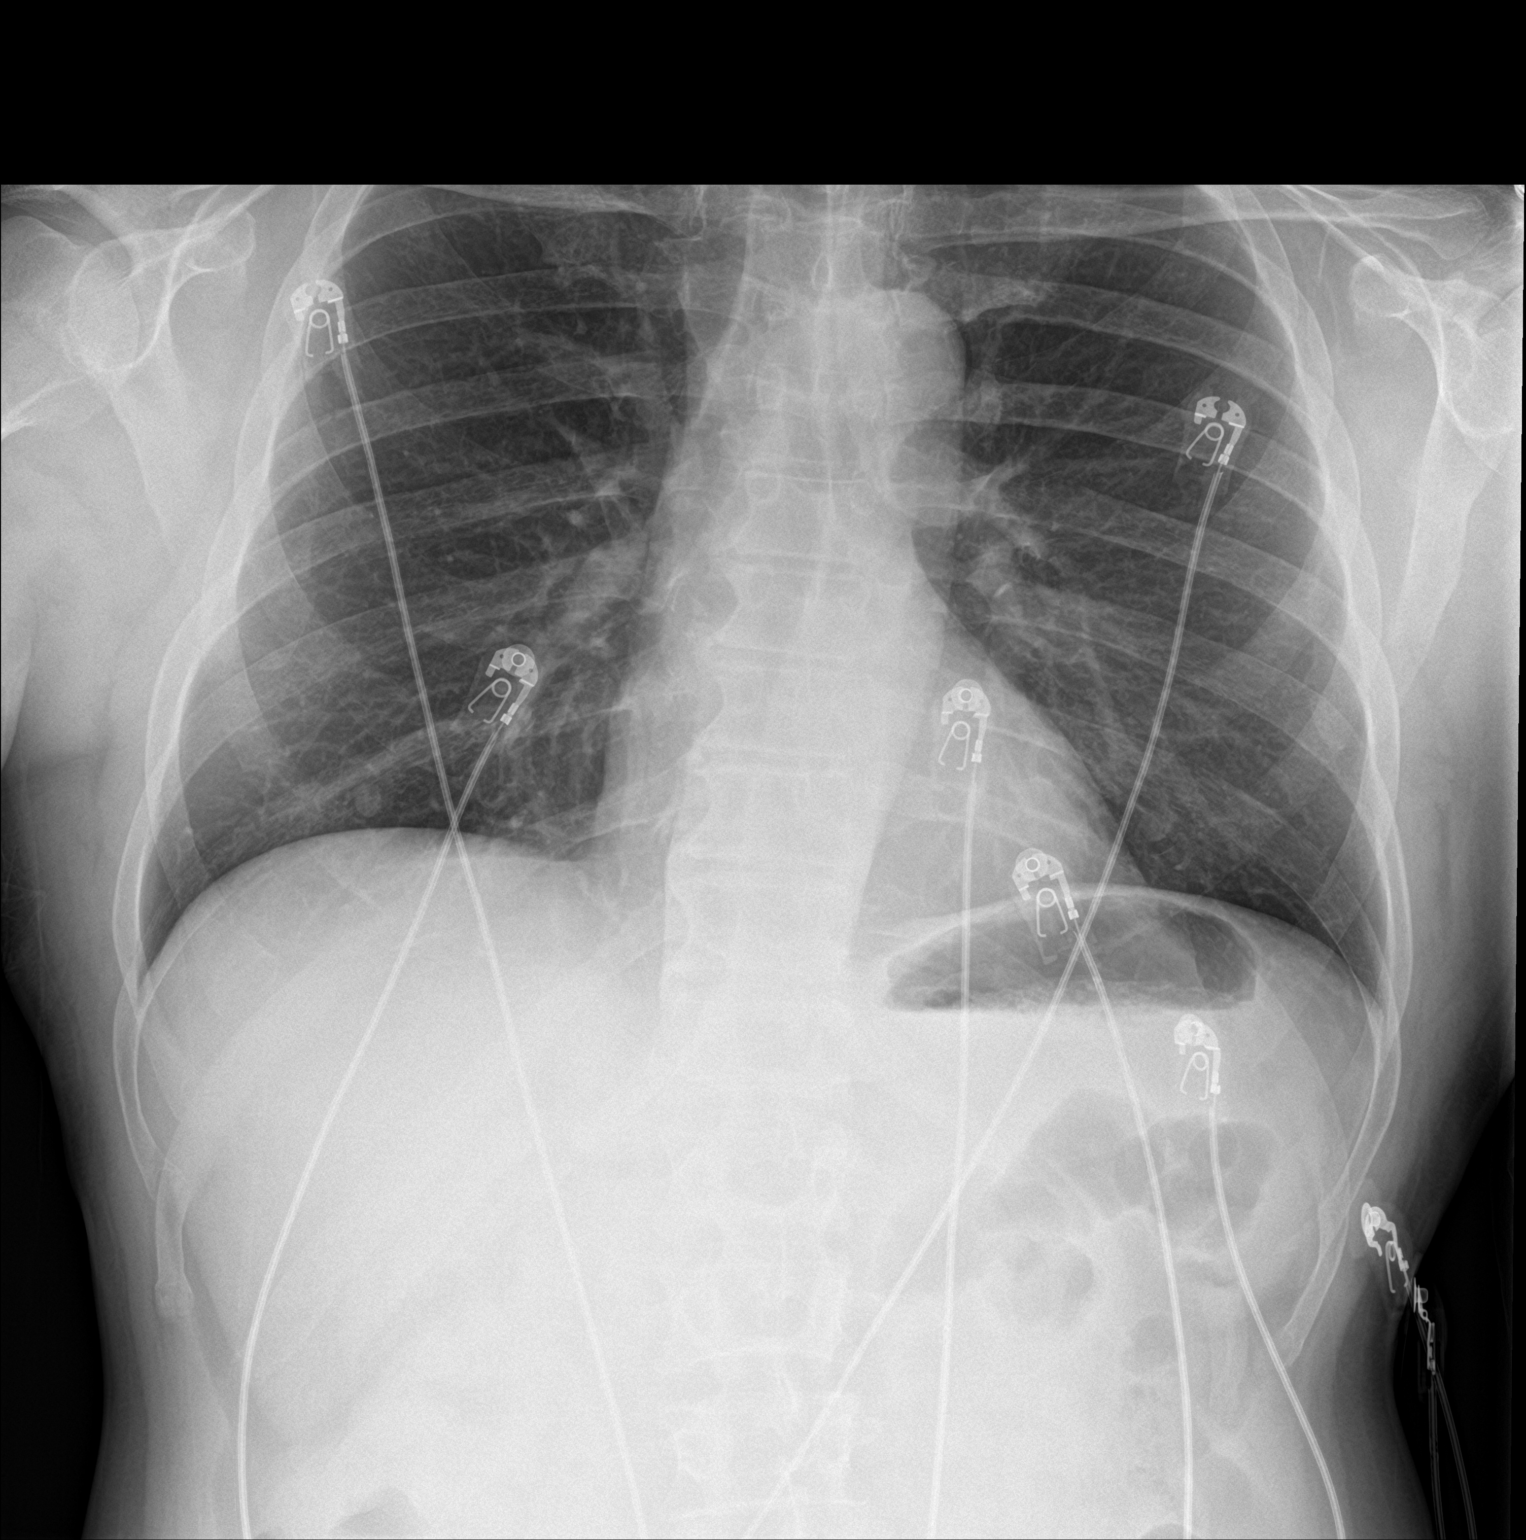

[chest lat]
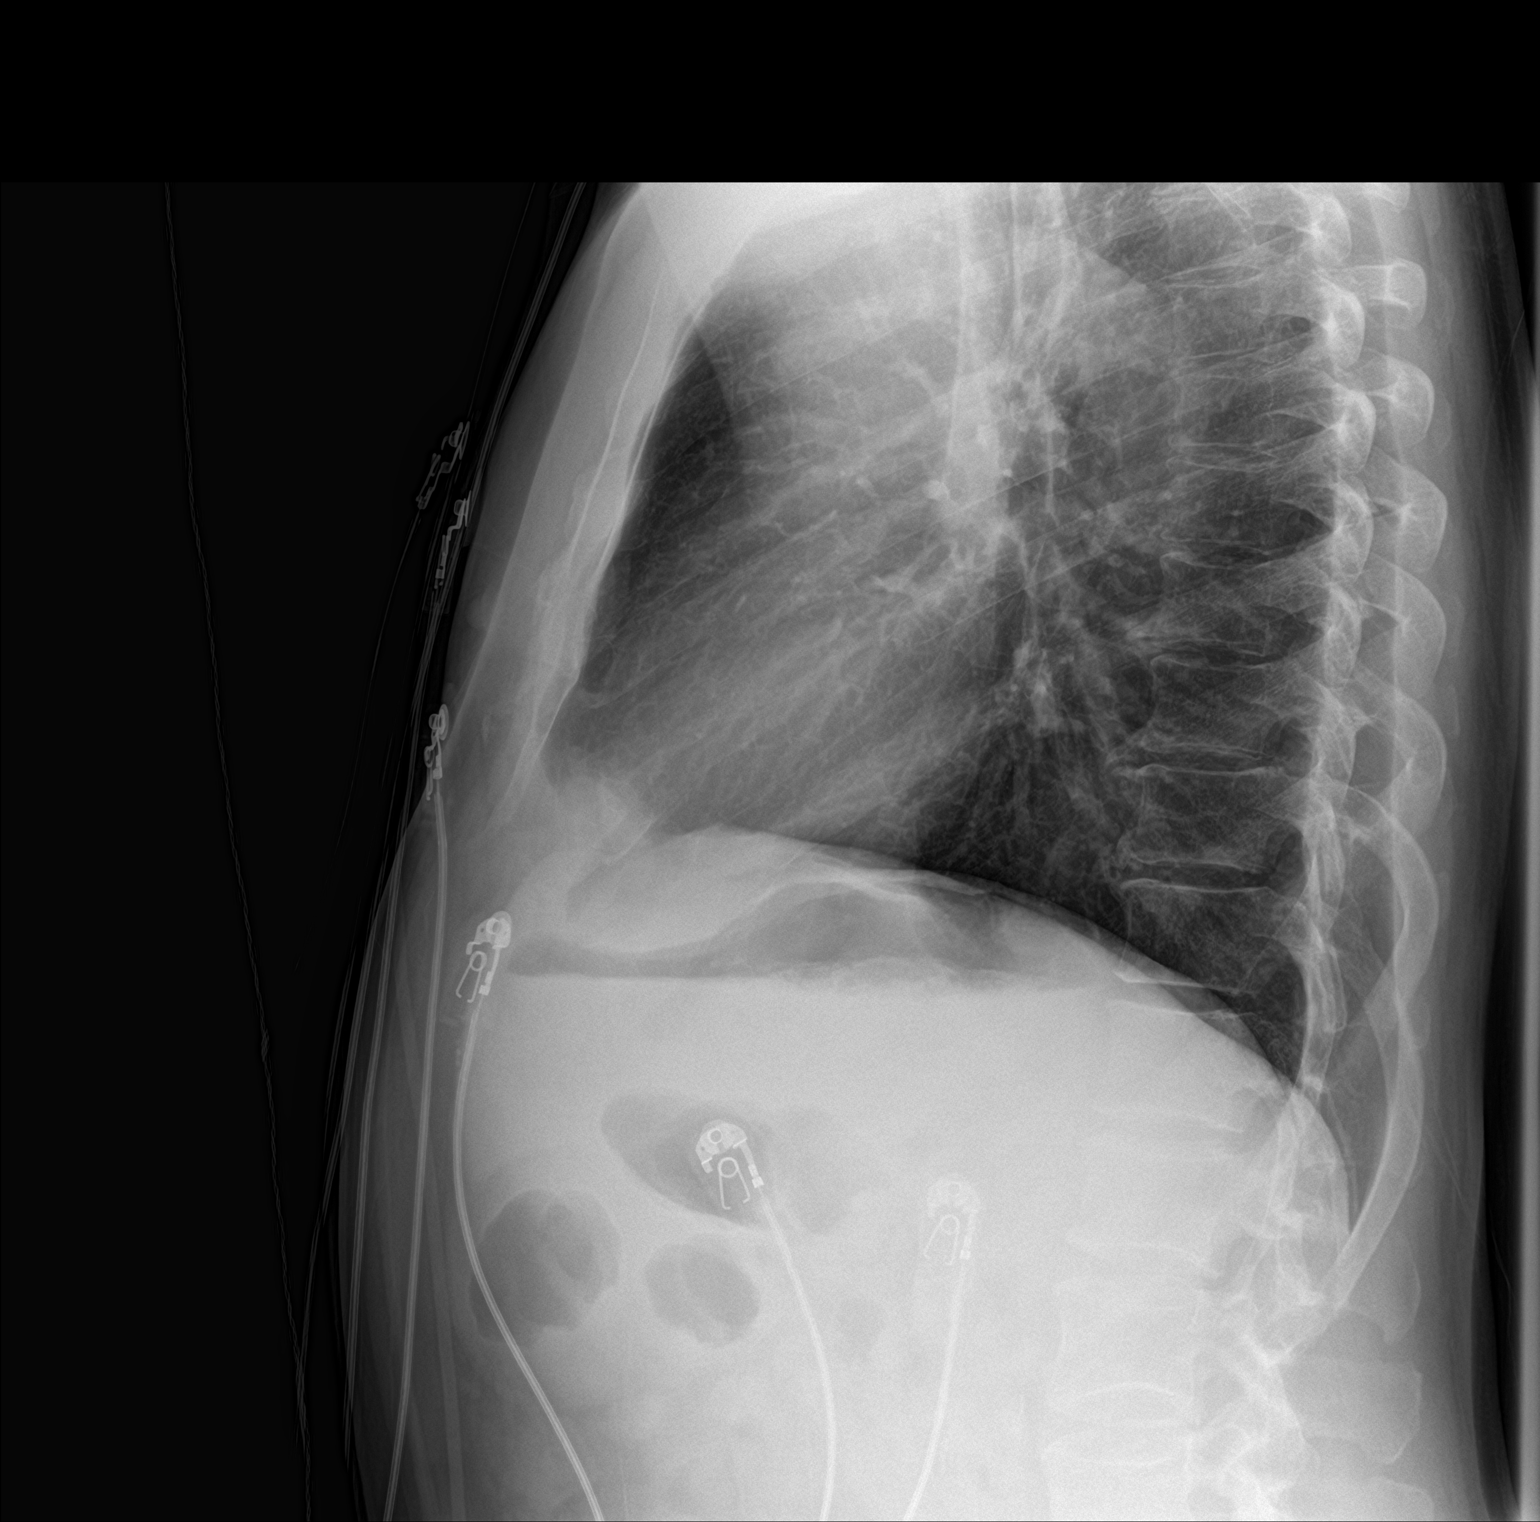

[2 of 2 positions shown; findings below may reference images not displayed]

FINDINGS: Incidental bilateral nipple shadows.

Normal cardiac size and mediastinal contours. Visualized tracheal
air column is within normal limits. Low normal lung volumes. Both
lungs appear clear. No pneumothorax or pleural effusion. No acute
osseous abnormality identified. Negative visible bowel gas pattern.
IMPRESSION: No cardiopulmonary abnormality.

## 2019-06-02 ENCOUNTER — Other Ambulatory Visit: Payer: Self-pay | Admitting: Family Medicine

## 2019-06-02 DIAGNOSIS — I10 Essential (primary) hypertension: Secondary | ICD-10-CM

## 2019-06-29 ENCOUNTER — Other Ambulatory Visit: Payer: Self-pay

## 2019-06-29 ENCOUNTER — Ambulatory Visit (INDEPENDENT_AMBULATORY_CARE_PROVIDER_SITE_OTHER): Payer: Medicare Other | Admitting: Family Medicine

## 2019-06-29 ENCOUNTER — Encounter: Payer: Self-pay | Admitting: Family Medicine

## 2019-06-29 VITALS — BP 116/82 | HR 80 | Temp 97.2°F | Ht 69.0 in | Wt 173.4 lb

## 2019-06-29 DIAGNOSIS — M109 Gout, unspecified: Secondary | ICD-10-CM

## 2019-06-29 DIAGNOSIS — G35 Multiple sclerosis: Secondary | ICD-10-CM

## 2019-06-29 DIAGNOSIS — R17 Unspecified jaundice: Secondary | ICD-10-CM

## 2019-06-29 DIAGNOSIS — E038 Other specified hypothyroidism: Secondary | ICD-10-CM | POA: Diagnosis not present

## 2019-06-29 DIAGNOSIS — E785 Hyperlipidemia, unspecified: Secondary | ICD-10-CM

## 2019-06-29 DIAGNOSIS — I10 Essential (primary) hypertension: Secondary | ICD-10-CM

## 2019-06-29 DIAGNOSIS — Z23 Encounter for immunization: Secondary | ICD-10-CM

## 2019-06-29 DIAGNOSIS — R739 Hyperglycemia, unspecified: Secondary | ICD-10-CM

## 2019-06-29 MED ORDER — ATORVASTATIN CALCIUM 20 MG PO TABS: ORAL_TABLET | ORAL | 1 refills | Status: DC

## 2019-06-29 NOTE — Addendum Note (Signed)
Addended by: Agnes Lawrence on: 06/29/2019 09:36 AM   Modules accepted: Orders

## 2019-06-29 NOTE — Patient Instructions (Signed)
I will let you know about bloodwork results from today once I see them.   We will then have you schedule bloodwork to complete in 6 months prior to next visit in office.

## 2019-06-29 NOTE — Progress Notes (Signed)
Adam Oliver DOB: 1956/03/31 Encounter date: 06/29/2019  This is a 64 y.o. male who presents for complete physical   History of present illness/Additional concerns: Last visit was July/2020. Hypertension: Telmisartan 20 mg daily Hyperlipidemia: Lipitor 20 mg daily Hypothyroid: Synthroid 50 mcg daily  Taking allopurinol 100 mg twice daily for gout prevention. MS:no new sx. Has had some neuropathy for years since initial episode.   Does golf regularly; sometimes walks holes. Not always good about other exercise.   Due for repeat colonoscopy; has cologuard kit; planning to do this.  Past Medical History:  Diagnosis Date  . Gout   . Hyperlipidemia   . Hypertension   . Hypothyroidism   . Multiple sclerosis ([place])    Past Surgical History:  Procedure Laterality Date  . OTHER SURGICAL HISTORY     right thumb tendon surgery   No Known Allergies Current Meds  Medication Sig  . allopurinol (ZYLOPRIM) 100 MG tablet Take 100 mg by mouth 2 (two) times daily.   Marland Kitchen atorvastatin (LIPITOR) 20 MG tablet TAKE 1 TABLET BY MOUTH  DAILY AT 6:00PM.  . levothyroxine (SYNTHROID) 50 MCG tablet TAKE 1 TABLET BY MOUTH 30  MINUTES BEFORE BREAKFAST  . telmisartan (MICARDIS) 40 MG tablet TAKE 1/2 TABLET BY MOUTH DAILY  . [DISCONTINUED] atorvastatin (LIPITOR) 20 MG tablet TAKE 1 TABLET BY MOUTH  DAILY AT 6:00PM.   Social History   Tobacco Use  . Smoking status: Never Smoker  . Smokeless tobacco: Never Used  Substance Use Topics  . Alcohol use: Yes    Comment: every other day - 2 drinks   Family History  Problem Relation Age of Onset  . Pancreatic cancer Mother   . Throat cancer Father   . Prostate cancer Brother 10  . Cancer Paternal Grandmother 47       unknown type  . Heart disease Paternal Grandfather   . Skin cancer Paternal Grandfather      Review of Systems  Constitutional: Negative for activity change, appetite change, chills, fatigue, fever and unexpected weight change.  HENT:  Negative for congestion, ear pain, hearing loss, sinus pressure, sinus pain, sore throat and trouble swallowing.   Eyes: Negative for pain and visual disturbance.  Respiratory: Negative for cough, chest tightness, shortness of breath and wheezing.   Cardiovascular: Negative for chest pain, palpitations and leg swelling.  Gastrointestinal: Negative for abdominal distention, abdominal pain, blood in stool, constipation, diarrhea, nausea and vomiting.  Genitourinary: Negative for decreased urine volume, difficulty urinating, dysuria, penile pain and testicular pain.  Musculoskeletal: Negative for arthralgias, back pain and joint swelling.  Skin: Negative for rash.  Neurological: Negative for dizziness, weakness, numbness and headaches.  Hematological: Negative for adenopathy. Does not bruise/bleed easily.  Psychiatric/Behavioral: Negative for agitation, sleep disturbance and suicidal ideas. The patient is not nervous/anxious.     CBC:  Lab Results  Component Value Date   WBC 6.8 12/22/2018   HGB 15.3 12/22/2018   HCT 45.8 12/22/2018   MCH 29.4 07/04/2018   MCHC 33.5 12/22/2018   RDW 13.7 12/22/2018   RDW 14.0 03/25/2013   PLT 236.0 12/22/2018   CMP: Lab Results  Component Value Date   NA 142 02/24/2019   K 4.4 02/24/2019   CL 108 02/24/2019   CO2 26 02/24/2019   ANIONGAP 11 07/04/2018   GLUCOSE 114 (H) 02/24/2019   GLUCOSE 110 (H) 06/20/2006   BUN 24 (H) 02/24/2019   CREATININE 1.34 02/24/2019   GFRAA 52 (L) 07/04/2018  CALCIUM 9.0 02/24/2019   PROT 6.8 02/24/2019   PROT 7.0 03/25/2013   BILITOT 1.5 (H) 02/24/2019   ALKPHOS 68 02/24/2019   ALT 21 02/24/2019   AST 18 02/24/2019   LIPID: Lab Results  Component Value Date   CHOL 150 12/22/2018   TRIG 136.0 12/22/2018   TRIG 114 06/20/2006   HDL 46.00 12/22/2018   LDLCALC 77 12/22/2018    Objective:  BP 116/82 (BP Location: Left Arm, Patient Position: Sitting, Cuff Size: Normal)   Pulse 80   Temp (!) 97.2 F  (36.2 C) (Temporal)   Ht '5\' 9"'  (1.753 m)   Wt 173 lb 6.4 oz (78.7 kg)   SpO2 98%   BMI 25.61 kg/m   Weight: 173 lb 6.4 oz (78.7 kg)   BP Readings from Last 3 Encounters:  06/29/19 116/82  12/22/18 118/70  07/05/18 (!) 132/95   Wt Readings from Last 3 Encounters:  06/29/19 173 lb 6.4 oz (78.7 kg)  12/22/18 165 lb 4.8 oz (75 kg)  07/04/18 160 lb (72.6 kg)    Physical Exam Constitutional:      General: He is not in acute distress.    Appearance: He is well-developed.  HENT:     Head: Normocephalic and atraumatic.     Right Ear: External ear normal.     Left Ear: External ear normal.     Nose: Nose normal.     Mouth/Throat:     Pharynx: No oropharyngeal exudate.  Eyes:     Conjunctiva/sclera: Conjunctivae normal.     Pupils: Pupils are equal, round, and reactive to light.  Neck:     Thyroid: No thyromegaly.  Cardiovascular:     Rate and Rhythm: Normal rate and regular rhythm.     Heart sounds: Normal heart sounds. No murmur. No friction rub. No gallop.   Pulmonary:     Effort: Pulmonary effort is normal. No respiratory distress.     Breath sounds: Normal breath sounds. No stridor. No wheezing or rales.  Abdominal:     General: Bowel sounds are normal.     Palpations: Abdomen is soft.  Genitourinary:    Prostate: Normal. Not enlarged, not tender and no nodules present.  Musculoskeletal:        General: Normal range of motion.     Cervical back: Neck supple.  Skin:    General: Skin is warm and dry.     Comments: Sun damage; multiple moles over arms, chest. None appear concerning.  Neurological:     Mental Status: He is alert and oriented to person, place, and time.     Motor: No weakness.     Gait: Gait is intact.     Deep Tendon Reflexes:     Reflex Scores:      Bicep reflexes are 1+ on the right side and 1+ on the left side.      Brachioradialis reflexes are 1+ on the right side and 1+ on the left side.      Patellar reflexes are 0 on the right side and 0 on  the left side.      Achilles reflexes are 0 on the right side and 0 on the left side. Psychiatric:        Behavior: Behavior normal.        Thought Content: Thought content normal.        Judgment: Judgment normal.     Assessment/Plan: Health Maintenance Due  Topic Date Due  . INFLUENZA VACCINE  01/17/2019   Health Maintenance reviewed. He will check expiration date of cologuard and let me know if new order needed. Flu shot today.  1. Hyperlipidemia, unspecified hyperlipidemia type Continue current medication - atorvastatin (LIPITOR) 20 MG tablet; TAKE 1 TABLET BY MOUTH  DAILY AT 6:00PM.  Dispense: 90 tablet; Refill: 1  2. Other specified hypothyroidism Has been stable; will recheck prior to next visit  3. MULTIPLE SCLEROSIS Stable. Does not wish to follow with specialist.  4. Essential hypertension Stable; continue current medication  5. Gout, unspecified cause, unspecified chronicity, unspecified site Stable; continue allopurinol.   6. Hyperglycemia - Hemoglobin A1c; Future  7. Elevated bilirubin - Comprehensive metabolic panel; Future  Return in about 6 months (around 12/27/2019) for Chronic condition visit.  Micheline Rough, MD

## 2019-07-08 ENCOUNTER — Ambulatory Visit: Payer: Medicare Other | Attending: Internal Medicine

## 2019-07-08 DIAGNOSIS — Z23 Encounter for immunization: Secondary | ICD-10-CM | POA: Insufficient documentation

## 2019-07-08 NOTE — Progress Notes (Signed)
   Covid-19 Vaccination Clinic  Name:  Adam Oliver    MRN: [id number] DOB: May 22, 1956  07/08/2019  [name] was observed post Covid-19 immunization for 15 minutes without incidence. He was provided with Vaccine Information Sheet and instruction to access the V-Safe system.   [name] was instructed to call 911 with any severe reactions post vaccine: Marland Kitchen Difficulty breathing  . Swelling of your face and throat  . A fast heartbeat  . A bad rash all over your body  . Dizziness and weakness    Immunizations Administered    Name Date Dose VIS Date Route   Pfizer COVID-19 Vaccine 07/08/2019  1:15 PM 0.3 mL 05/29/2019 Intramuscular   Manufacturer: [place]   Lot: [id number]   [place]: [id number]

## 2019-07-28 ENCOUNTER — Ambulatory Visit: Payer: Medicare Other | Attending: Internal Medicine

## 2019-07-28 DIAGNOSIS — Z23 Encounter for immunization: Secondary | ICD-10-CM | POA: Insufficient documentation

## 2019-07-28 NOTE — Progress Notes (Signed)
   Covid-19 Vaccination Clinic  Name:  Adam Oliver    MRN: [id number] DOB: [birth year]  07/28/2019  [name] was observed post Covid-19 immunization for 15 minutes without incidence. He was provided with Vaccine Information Sheet and instruction to access the V-Safe system.   [name] was instructed to call 911 with any severe reactions post vaccine: Marland Kitchen Difficulty breathing  . Swelling of your face and throat  . A fast heartbeat  . A bad rash all over your body  . Dizziness and weakness    Immunizations Administered    Name Date Dose VIS Date Route   Pfizer COVID-19 Vaccine 07/28/2019  8:41 AM 0.3 mL 05/29/2019 Intramuscular   Manufacturer: [place]   Lot: [id number]   [place]: [id number]

## 2019-09-07 ENCOUNTER — Telehealth: Payer: Self-pay | Admitting: Family Medicine

## 2019-09-07 NOTE — Telephone Encounter (Signed)
Medication Refill: Allopurinol Re-order cologuard kit  Pharmacy: [facility] [address] TYY:[phone]

## 2019-09-08 ENCOUNTER — Other Ambulatory Visit: Payer: Self-pay | Admitting: Family Medicine

## 2019-09-08 DIAGNOSIS — Z1211 Encounter for screening for malignant neoplasm of colon: Secondary | ICD-10-CM

## 2019-09-08 MED ORDER — ALLOPURINOL 100 MG PO TABS: 100.0000 mg | ORAL_TABLET | Freq: Two times a day (BID) | ORAL | 1 refills | Status: DC

## 2019-09-08 NOTE — Telephone Encounter (Signed)
Med refilled; cologuard order placed.

## 2019-09-09 NOTE — Telephone Encounter (Signed)
Cologuard order faxed to [facility] at [phone].  

## 2019-10-03 ENCOUNTER — Other Ambulatory Visit: Payer: Self-pay | Admitting: Family Medicine

## 2019-10-05 ENCOUNTER — Telehealth: Payer: Self-pay | Admitting: Family Medicine

## 2019-10-05 MED ORDER — LEVOTHYROXINE SODIUM 50 MCG PO TABS: ORAL_TABLET | ORAL | 1 refills | Status: DC

## 2019-10-05 NOTE — Telephone Encounter (Signed)
Rx done. 

## 2019-10-05 NOTE — Telephone Encounter (Signed)
Pt call and need a refill on levothyroxine (SYNTHROID) 50 MCG tablet [id number] [place] [place] [facility] [place] Phone:  [phone]  Fax:  [phone]

## 2019-11-26 ENCOUNTER — Other Ambulatory Visit: Payer: Self-pay | Admitting: Family Medicine

## 2019-11-26 DIAGNOSIS — I10 Essential (primary) hypertension: Secondary | ICD-10-CM

## 2019-12-26 ENCOUNTER — Other Ambulatory Visit: Payer: Self-pay | Admitting: Family Medicine

## 2019-12-26 DIAGNOSIS — E785 Hyperlipidemia, unspecified: Secondary | ICD-10-CM

## 2019-12-28 ENCOUNTER — Other Ambulatory Visit: Payer: Self-pay

## 2019-12-28 ENCOUNTER — Other Ambulatory Visit (INDEPENDENT_AMBULATORY_CARE_PROVIDER_SITE_OTHER): Payer: Medicare Other

## 2019-12-28 DIAGNOSIS — R17 Unspecified jaundice: Secondary | ICD-10-CM | POA: Diagnosis not present

## 2019-12-28 DIAGNOSIS — R739 Hyperglycemia, unspecified: Secondary | ICD-10-CM

## 2019-12-28 NOTE — Addendum Note (Signed)
Addended by: Marrion Coy on: 12/28/2019 08:14 AM   Modules accepted: Orders

## 2019-12-29 LAB — COMPREHENSIVE METABOLIC PANEL
AG Ratio: 1.7 (calc) (ref 1.0–2.5)
ALT: 21 U/L (ref 9–46)
AST: 17 U/L (ref 10–35)
Albumin: 4.3 g/dL (ref 3.6–5.1)
Alkaline phosphatase (APISO): 69 U/L (ref 35–144)
BUN/Creatinine Ratio: 19 (calc) (ref 6–22)
BUN: 27 mg/dL — ABNORMAL HIGH (ref 7–25)
CO2: 27 mmol/L (ref 20–32)
Calcium: 9.2 mg/dL (ref 8.6–10.3)
Chloride: 105 mmol/L (ref 98–110)
Creat: 1.45 mg/dL — ABNORMAL HIGH (ref 0.70–1.25)
Globulin: 2.6 g/dL (calc) (ref 1.9–3.7)
Glucose, Bld: 110 mg/dL — ABNORMAL HIGH (ref 65–99)
Potassium: 4.6 mmol/L (ref 3.5–5.3)
Sodium: 140 mmol/L (ref 135–146)
Total Bilirubin: 1.2 mg/dL (ref 0.2–1.2)
Total Protein: 6.9 g/dL (ref 6.1–8.1)

## 2019-12-29 LAB — HEMOGLOBIN A1C
Hgb A1c MFr Bld: 5.8 % of total Hgb — ABNORMAL HIGH (ref <5.7)
Mean Plasma Glucose: 120 (calc)
eAG (mmol/L): 6.6 (calc)

## 2020-01-04 ENCOUNTER — Ambulatory Visit: Payer: Medicare Other | Admitting: Family Medicine

## 2020-01-07 ENCOUNTER — Ambulatory Visit (INDEPENDENT_AMBULATORY_CARE_PROVIDER_SITE_OTHER): Payer: Medicare Other | Admitting: Family Medicine

## 2020-01-07 ENCOUNTER — Encounter: Payer: Self-pay | Admitting: Family Medicine

## 2020-01-07 ENCOUNTER — Other Ambulatory Visit: Payer: Self-pay

## 2020-01-07 VITALS — BP 150/100 | HR 83 | Temp 98.0°F | Ht 69.0 in | Wt 171.6 lb

## 2020-01-07 DIAGNOSIS — N401 Enlarged prostate with lower urinary tract symptoms: Secondary | ICD-10-CM

## 2020-01-07 DIAGNOSIS — I10 Essential (primary) hypertension: Secondary | ICD-10-CM

## 2020-01-07 DIAGNOSIS — Z23 Encounter for immunization: Secondary | ICD-10-CM | POA: Diagnosis not present

## 2020-01-07 DIAGNOSIS — R7989 Other specified abnormal findings of blood chemistry: Secondary | ICD-10-CM

## 2020-01-07 DIAGNOSIS — R350 Frequency of micturition: Secondary | ICD-10-CM

## 2020-01-07 DIAGNOSIS — E785 Hyperlipidemia, unspecified: Secondary | ICD-10-CM | POA: Diagnosis not present

## 2020-01-07 DIAGNOSIS — E538 Deficiency of other specified B group vitamins: Secondary | ICD-10-CM

## 2020-01-07 DIAGNOSIS — G35 Multiple sclerosis: Secondary | ICD-10-CM

## 2020-01-07 DIAGNOSIS — M109 Gout, unspecified: Secondary | ICD-10-CM

## 2020-01-07 DIAGNOSIS — E038 Other specified hypothyroidism: Secondary | ICD-10-CM

## 2020-01-07 DIAGNOSIS — R739 Hyperglycemia, unspecified: Secondary | ICD-10-CM

## 2020-01-07 DIAGNOSIS — Z8042 Family history of malignant neoplasm of prostate: Secondary | ICD-10-CM

## 2020-01-07 MED ORDER — SHINGRIX 50 MCG/0.5ML IM SUSR
0.5000 mL | Freq: Once | INTRAMUSCULAR | 0 refills | Status: AC
Start: 2020-01-07 — End: 2020-01-07

## 2020-01-07 NOTE — Progress Notes (Addendum)
Adam Oliver DOB: 02-28-1956 Encounter date: 01/07/2020  This is a 64 y.o. male who presents with Chief Complaint  Patient presents with  . Follow-up    History of present illness: Last visit was January 2021.  Blood work completed July/2021.  BUN and creatinine elevated, A1c at 5.8.  Last visit was July/2020.  Hypertension: Telmisartan 20 mg daily. Not checking pressures at home. Took this morning around 8 oclock.   Hyperlipidemia: Lipitor 20 mg daily. No muscle aches/cramps.   Hypothyroid: Synthroid 50 mcg daily. No problems with this.   Taking allopurinol 100 mg twice daily for gout prevention. No flares.  MS:no new sx. Has had some neuropathy for years since initial episode.   Due for repeat colonoscopy; Cologuard was reordered in March, not yet completed.  Waking twice at night to urinate.    No Known Allergies Current Meds  Medication Sig  . allopurinol (ZYLOPRIM) 100 MG tablet Take 1 tablet (100 mg total) by mouth 2 (two) times daily.  Marland Kitchen atorvastatin (LIPITOR) 20 MG tablet TAKE 1 TABLET BY MOUTH DAILY AT 6:00PM.  . levothyroxine (SYNTHROID) 50 MCG tablet TAKE 1 TABLET BY MOUTH 30  MINUTES BEFORE BREAKFAST  . telmisartan (MICARDIS) 40 MG tablet TAKE 1/2 TABLET BY MOUTH DAILY    Review of Systems  Constitutional: Negative for activity change, appetite change, chills, fatigue, fever and unexpected weight change.  HENT: Negative for congestion, ear pain, hearing loss, sinus pressure, sinus pain, sore throat and trouble swallowing.   Eyes: Negative for pain and visual disturbance.  Respiratory: Negative for cough, chest tightness, shortness of breath and wheezing.   Cardiovascular: Negative for chest pain, palpitations and leg swelling.  Gastrointestinal: Negative for abdominal distention, abdominal pain, blood in stool, constipation, diarrhea, nausea and vomiting.  Genitourinary: Negative for decreased urine volume, difficulty urinating, dysuria, penile pain and  testicular pain.  Musculoskeletal: Negative for arthralgias, back pain and joint swelling.  Skin: Negative for rash.  Neurological: Negative for dizziness, weakness, numbness and headaches.  Hematological: Negative for adenopathy. Does not bruise/bleed easily.  Psychiatric/Behavioral: Negative for agitation, sleep disturbance and suicidal ideas. The patient is not nervous/anxious.     Objective:  BP (!) 150/100 (BP Location: Left Arm, Patient Position: Sitting, Cuff Size: Normal)   Pulse 83   Temp 98 F (36.7 C) (Oral)   Ht 5\' 9"  (1.753 m)   Wt 171 lb 9.6 oz (77.8 kg)   BMI 25.34 kg/m   Weight: 171 lb 9.6 oz (77.8 kg)   BP Readings from Last 3 Encounters:  01/07/20 (!) 150/100  06/29/19 116/82  12/22/18 118/70   Wt Readings from Last 3 Encounters:  01/07/20 171 lb 9.6 oz (77.8 kg)  06/29/19 173 lb 6.4 oz (78.7 kg)  12/22/18 165 lb 4.8 oz (75 kg)    Physical Exam Constitutional:      General: He is not in acute distress.    Appearance: He is well-developed.  HENT:     Head: Normocephalic and atraumatic.     Right Ear: External ear normal.     Left Ear: External ear normal.     Nose: Nose normal.     Mouth/Throat:     Pharynx: No oropharyngeal exudate.  Eyes:     Conjunctiva/sclera: Conjunctivae normal.     Pupils: Pupils are equal, round, and reactive to light.  Neck:     Thyroid: No thyromegaly.  Cardiovascular:     Rate and Rhythm: Normal rate and regular rhythm.  Heart sounds: Normal heart sounds. No murmur heard.  No friction rub. No gallop.   Pulmonary:     Effort: Pulmonary effort is normal. No respiratory distress.     Breath sounds: Normal breath sounds. No stridor. No wheezing or rales.  Abdominal:     General: Bowel sounds are normal.     Palpations: Abdomen is soft.  Musculoskeletal:        General: Normal range of motion.     Cervical back: Neck supple.  Skin:    General: Skin is warm and dry.  Neurological:     Mental Status: He is alert  and oriented to person, place, and time.  Psychiatric:        Behavior: Behavior normal.        Thought Content: Thought content normal.        Judgment: Judgment normal.     Assessment/Plan  1. Essential hypertension Elevated in office today.  We are going to increase telmisartan to 40 mg daily.  He will update me with pressures in a few weeks through [place]. - Basic metabolic panel; Future  2. Other specified hypothyroidism Well controlled, continue synthroid  3. MULTIPLE SCLEROSIS No new symptoms.  4. Hyperlipidemia, unspecified hyperlipidemia type Continue Lipitor. - Lipid panel; Future  5. Gout, unspecified cause, unspecified chronicity, unspecified site No flares on current allopurinol dose.  6. Hyperglycemia We discussed working on low carbohydrate diet and regular exercise to help with sugar control.  7. Family history of prostate cancer - PSA; Future  8. Elevated serum creatinine We will plan to recheck with follow-up blood work in the fall.  9. Need for pneumococcal vaccination - Pneumococcal polysaccharide vaccine 23-valent greater than or equal to 2yo subcutaneous/IM  10. Benign prostatic hyperplasia with urinary frequency  - PSA; Future   Return in about 6 months (around 07/09/2020) for bloodwork in september; follow up in 6 months.    Micheline Rough, MD

## 2020-01-07 NOTE — Patient Instructions (Addendum)
Get shingrix and Tdap at pharmacy  Increase the telmisartan to 1 tablet daily (40mg ). If you can; check pressures at home a few days/week and update me in 3 weeks.

## 2020-01-18 NOTE — Addendum Note (Signed)
Addended by: Marrion Coy on: 01/18/2020 04:13 PM   Modules accepted: Orders

## 2020-02-23 ENCOUNTER — Other Ambulatory Visit: Payer: Medicare Other

## 2020-02-24 ENCOUNTER — Other Ambulatory Visit: Payer: Self-pay

## 2020-02-24 ENCOUNTER — Other Ambulatory Visit: Payer: Medicare Other

## 2020-02-24 DIAGNOSIS — N401 Enlarged prostate with lower urinary tract symptoms: Secondary | ICD-10-CM

## 2020-02-24 DIAGNOSIS — I10 Essential (primary) hypertension: Secondary | ICD-10-CM

## 2020-02-24 DIAGNOSIS — E538 Deficiency of other specified B group vitamins: Secondary | ICD-10-CM

## 2020-02-24 DIAGNOSIS — E038 Other specified hypothyroidism: Secondary | ICD-10-CM

## 2020-02-24 DIAGNOSIS — E785 Hyperlipidemia, unspecified: Secondary | ICD-10-CM

## 2020-02-24 DIAGNOSIS — Z8042 Family history of malignant neoplasm of prostate: Secondary | ICD-10-CM

## 2020-02-24 DIAGNOSIS — M109 Gout, unspecified: Secondary | ICD-10-CM

## 2020-02-25 LAB — LIPID PANEL
Cholesterol: 185 mg/dL (ref <200)
HDL: 41 mg/dL (ref >=40)
LDL Cholesterol (Calc): 108 mg/dL (calc) — ABNORMAL HIGH
Non-HDL Cholesterol (Calc): 144 mg/dL (calc) — ABNORMAL HIGH (ref <130)
Total CHOL/HDL Ratio: 4.5 (calc) (ref <5.0)
Triglycerides: 255 mg/dL — ABNORMAL HIGH (ref <150)

## 2020-02-25 LAB — URIC ACID: Uric Acid, Serum: 6.3 mg/dL (ref 4.0–8.0)

## 2020-02-25 LAB — VITAMIN B12: Vitamin B-12: 523 pg/mL (ref 200–1100)

## 2020-02-25 LAB — BASIC METABOLIC PANEL
BUN/Creatinine Ratio: 15 (calc) (ref 6–22)
BUN: 20 mg/dL (ref 7–25)
CO2: 25 mmol/L (ref 20–32)
Calcium: 8.9 mg/dL (ref 8.6–10.3)
Chloride: 108 mmol/L (ref 98–110)
Creat: 1.37 mg/dL — ABNORMAL HIGH (ref 0.70–1.25)
Glucose, Bld: 121 mg/dL — ABNORMAL HIGH (ref 65–99)
Potassium: 4.1 mmol/L (ref 3.5–5.3)
Sodium: 142 mmol/L (ref 135–146)

## 2020-02-25 LAB — TSH: TSH: 2.82 mIU/L (ref 0.40–4.50)

## 2020-02-25 LAB — PSA: PSA: 1.8 ng/mL (ref <=4.0)

## 2020-03-09 ENCOUNTER — Other Ambulatory Visit: Payer: Self-pay | Admitting: Family Medicine

## 2020-03-26 ENCOUNTER — Other Ambulatory Visit: Payer: Self-pay | Admitting: Family Medicine

## 2020-05-26 ENCOUNTER — Telehealth: Payer: Self-pay | Admitting: Family Medicine

## 2020-05-26 ENCOUNTER — Other Ambulatory Visit: Payer: Self-pay | Admitting: Family Medicine

## 2020-05-26 DIAGNOSIS — I10 Essential (primary) hypertension: Secondary | ICD-10-CM

## 2020-05-26 MED ORDER — TELMISARTAN 40 MG PO TABS: 20.0000 mg | ORAL_TABLET | Freq: Every day | ORAL | 1 refills | Status: DC

## 2020-05-26 NOTE — Telephone Encounter (Signed)
Done

## 2020-05-26 NOTE — Telephone Encounter (Signed)
Patient is calling and requesting a refill and a dosage change for telmisartan (MICARDIS) 40 MG tablet [id number]  [place], [place] [address]  Phone:  [phone] Fax:  [phone]  CB is [phone]

## 2020-06-19 ENCOUNTER — Other Ambulatory Visit: Payer: Self-pay | Admitting: Family Medicine

## 2020-06-19 DIAGNOSIS — E785 Hyperlipidemia, unspecified: Secondary | ICD-10-CM

## 2020-07-04 ENCOUNTER — Ambulatory Visit: Payer: Medicare Other | Admitting: Family Medicine

## 2020-07-07 ENCOUNTER — Ambulatory Visit: Payer: Medicare Other | Admitting: Family Medicine

## 2020-07-14 ENCOUNTER — Telehealth: Payer: Self-pay | Admitting: Family Medicine

## 2020-07-14 NOTE — Telephone Encounter (Signed)
Spoke with the pt as the last Rx given in December was given for a 3 month supply with a refill.  Patient stated after his last visit, Dr Ethlyn Gallery stated if his BP was elevated to take 1 tablet daily instead of 1/2, he has been taking a whole pill for one month and has been out of the medication for the past 2 days.  Stated he also wanted Dr Ethlyn Gallery to know his BP was in the 140/90-130/90s prior to the past 2 days and without medication readings have been around 119/78.  Patient has a follow up appt on 2/28.  Message sent to PCP.

## 2020-07-14 NOTE — Telephone Encounter (Signed)
Patient called needing a Rx refill for telmisartan (MICARDIS) 40 MG tablet  He would like a call before the Rx is sent to the pharmacy because it's complicated.  Please advise

## 2020-07-15 ENCOUNTER — Other Ambulatory Visit: Payer: Self-pay | Admitting: Family Medicine

## 2020-07-15 DIAGNOSIS — I10 Essential (primary) hypertension: Secondary | ICD-10-CM

## 2020-07-15 MED ORDER — TELMISARTAN 40 MG PO TABS: 40.0000 mg | ORAL_TABLET | Freq: Every day | ORAL | 1 refills | Status: DC

## 2020-07-15 NOTE — Telephone Encounter (Signed)
I sent a refill of the medication at the 40mg  dose, but I did call him to try and clarify since he said that bp was actually improved without medication in that message? Just wanted to make sure that was correct. Please encourage him to continue to monitor and update me next week. We can certainly rethink medications if this is the case. I would expect bp to climb slowly without medication on board so I refilled 40mg  so he has something until we connect.

## 2020-07-18 NOTE — Telephone Encounter (Signed)
Left a detailed message with the information below at the pts cell number. 

## 2020-08-12 ENCOUNTER — Other Ambulatory Visit: Payer: Self-pay

## 2020-08-15 ENCOUNTER — Ambulatory Visit (INDEPENDENT_AMBULATORY_CARE_PROVIDER_SITE_OTHER): Payer: Medicare Other | Admitting: Family Medicine

## 2020-08-15 ENCOUNTER — Other Ambulatory Visit: Payer: Self-pay

## 2020-08-15 VITALS — BP 128/90 | HR 76 | Temp 97.7°F | Ht 69.0 in | Wt 170.0 lb

## 2020-08-15 DIAGNOSIS — R739 Hyperglycemia, unspecified: Secondary | ICD-10-CM | POA: Diagnosis not present

## 2020-08-15 DIAGNOSIS — E785 Hyperlipidemia, unspecified: Secondary | ICD-10-CM

## 2020-08-15 DIAGNOSIS — G35 Multiple sclerosis: Secondary | ICD-10-CM

## 2020-08-15 DIAGNOSIS — R0789 Other chest pain: Secondary | ICD-10-CM

## 2020-08-15 DIAGNOSIS — E038 Other specified hypothyroidism: Secondary | ICD-10-CM | POA: Diagnosis not present

## 2020-08-15 DIAGNOSIS — I10 Essential (primary) hypertension: Secondary | ICD-10-CM

## 2020-08-15 DIAGNOSIS — K30 Functional dyspepsia: Secondary | ICD-10-CM

## 2020-08-15 DIAGNOSIS — M109 Gout, unspecified: Secondary | ICD-10-CM

## 2020-08-15 DIAGNOSIS — Z1211 Encounter for screening for malignant neoplasm of colon: Secondary | ICD-10-CM

## 2020-08-15 LAB — LIPID PANEL
Cholesterol: 180 mg/dL (ref 0–200)
HDL: 43.3 mg/dL (ref >39.00)
NonHDL: 137.19
Total CHOL/HDL Ratio: 4
Triglycerides: 206 mg/dL — ABNORMAL HIGH (ref 0.0–149.0)
VLDL: 41.2 mg/dL — ABNORMAL HIGH (ref 0.0–40.0)

## 2020-08-15 LAB — COMPREHENSIVE METABOLIC PANEL
ALT: 24 U/L (ref 0–53)
AST: 19 U/L (ref 0–37)
Albumin: 4.1 g/dL (ref 3.5–5.2)
Alkaline Phosphatase: 63 U/L (ref 39–117)
BUN: 18 mg/dL (ref 6–23)
CO2: 25 mEq/L (ref 19–32)
Calcium: 9.2 mg/dL (ref 8.4–10.5)
Chloride: 106 mEq/L (ref 96–112)
Creatinine, Ser: 1.2 mg/dL (ref 0.40–1.50)
GFR: 62.91 mL/min (ref >60.00)
Glucose, Bld: 112 mg/dL — ABNORMAL HIGH (ref 70–99)
Potassium: 4.3 mEq/L (ref 3.5–5.1)
Sodium: 140 mEq/L (ref 135–145)
Total Bilirubin: 1.8 mg/dL — ABNORMAL HIGH (ref 0.2–1.2)
Total Protein: 6.8 g/dL (ref 6.0–8.3)

## 2020-08-15 LAB — CBC WITH DIFFERENTIAL/PLATELET
Basophils Absolute: 0.1 10*3/uL (ref 0.0–0.1)
Basophils Relative: 1.3 % (ref 0.0–3.0)
Eosinophils Absolute: 0.3 10*3/uL (ref 0.0–0.7)
Eosinophils Relative: 5.4 % — ABNORMAL HIGH (ref 0.0–5.0)
HCT: 43.3 % (ref 39.0–52.0)
Hemoglobin: 14.7 g/dL (ref 13.0–17.0)
Lymphocytes Relative: 25.1 % (ref 12.0–46.0)
Lymphs Abs: 1.6 10*3/uL (ref 0.7–4.0)
MCHC: 33.8 g/dL (ref 30.0–36.0)
MCV: 89.8 fl (ref 78.0–100.0)
Monocytes Absolute: 0.7 10*3/uL (ref 0.1–1.0)
Monocytes Relative: 10.9 % (ref 3.0–12.0)
Neutro Abs: 3.7 10*3/uL (ref 1.4–7.7)
Neutrophils Relative %: 57.3 % (ref 43.0–77.0)
Platelets: 253 10*3/uL (ref 150.0–400.0)
RBC: 4.82 Mil/uL (ref 4.22–5.81)
RDW: 14.3 % (ref 11.5–15.5)
WBC: 6.4 10*3/uL (ref 4.0–10.5)

## 2020-08-15 LAB — LDL CHOLESTEROL, DIRECT: Direct LDL: 98 mg/dL

## 2020-08-15 LAB — TSH: TSH: 2.65 u[IU]/mL (ref 0.35–4.50)

## 2020-08-15 LAB — HEMOGLOBIN A1C: Hgb A1c MFr Bld: 5.9 % (ref 4.6–6.5)

## 2020-08-15 MED ORDER — AMLODIPINE BESYLATE 5 MG PO TABS: 5.0000 mg | ORAL_TABLET | Freq: Every day | ORAL | 1 refills | Status: DC

## 2020-08-15 NOTE — Progress Notes (Signed)
Adam Oliver DOB: 1956-01-06 Encounter date: 08/15/2020  This is a 65 y.o. male who presents with Chief Complaint  Patient presents with  . Hypertension  . Hyperlipidemia    History of present illness: Got home bp cuff - states that it is always in the low 130's/90. Went from half pill to whole pill. Tries to take different times but doesn't seem to make a difference. In past was on hctz with the telmisartan - isn't sure this did anything.   In last 2-3 months has had chest pressure - feels like indigestion. Notes after eating, more at night. Feels better after he burps. Trying to think back about what he eats. Might be more with bread that it is issue. Sometimes notes after lunch. Has taken tums twice which makes it feel better. When he gets sensation feels it for a few hours and sometimes lasts until he falls asleep. Just uncomfortable, not painful. No other associated resp/cardiac sx. For long time; bowels move about every 3rd day - but goes a couple of times on that third day.   Hypertension: Telmisartan 40 mg daily (increased at last visit 12/2019)  Hyperlipidemia: Lipitor 20 mg daily. No muscle aches/cramps.   Hypothyroid: Synthroid 50 mcg daily. No problems with this.   Taking allopurinol 100 mg twice daily for gout prevention. No flares.  MS:no new sx. Has had some neuropathy for years since initial episode.   cologuard not completed - wanted to talk about this. Since brother diagnosed with prostate cancer just wondering if he should get full colonoscopy (last was 14-15 years ago).   No Known Allergies Current Meds  Medication Sig  . allopurinol (ZYLOPRIM) 100 MG tablet TAKE 1 TABLET BY MOUTH TWICE A DAY  . amLODipine (NORVASC) 5 MG tablet Take 1 tablet (5 mg total) by mouth daily.  Marland Kitchen atorvastatin (LIPITOR) 20 MG tablet TAKE 1 TABLET BY MOUTH DAILY AT 6:00PM.  . levothyroxine (SYNTHROID) 50 MCG tablet TAKE 1 TABLET BY MOUTH 30 MINUTES BEFORE BREAKFAST  . telmisartan  (MICARDIS) 40 MG tablet Take 1 tablet (40 mg total) by mouth daily.    Review of Systems  Constitutional: Negative for chills, fatigue and fever.  Respiratory: Negative for cough, chest tightness, shortness of breath and wheezing.   Cardiovascular: Negative for chest pain, palpitations and leg swelling.       Chest pressure; see hpi. No symptoms with exercise. No associated resp sx. Only in evening about 2x/week.    Objective:  BP 128/90 (BP Location: Right Arm, Patient Position: Sitting, Cuff Size: Large)   Pulse 76   Temp 97.7 F (36.5 C) (Oral)   Ht 5\' 9"  (1.753 m)   Wt 170 lb (77.1 kg)   SpO2 97%   BMI 25.10 kg/m   Weight: 170 lb (77.1 kg)   BP Readings from Last 3 Encounters:  08/15/20 128/90  01/07/20 (!) 150/100  06/29/19 116/82   Wt Readings from Last 3 Encounters:  08/15/20 170 lb (77.1 kg)  01/07/20 171 lb 9.6 oz (77.8 kg)  06/29/19 173 lb 6.4 oz (78.7 kg)    Physical Exam Constitutional:      General: He is not in acute distress.    Appearance: He is well-developed.  Cardiovascular:     Rate and Rhythm: Normal rate and regular rhythm.     Heart sounds: Normal heart sounds. No murmur heard. No friction rub.  Pulmonary:     Effort: Pulmonary effort is normal. No respiratory distress.  Breath sounds: Normal breath sounds. No wheezing or rales.  Abdominal:     General: Abdomen is flat. Bowel sounds are normal.     Palpations: Abdomen is soft.     Tenderness: There is no abdominal tenderness.  Musculoskeletal:     Right lower leg: No edema.     Left lower leg: No edema.  Neurological:     Mental Status: He is alert and oriented to person, place, and time.  Psychiatric:        Behavior: Behavior normal.     Assessment/Plan  1. Essential hypertension We are going to add amlodipine for better bp control. If he does well we can put this in combo med for future with his 40mg  telmisartan. - amLODipine (NORVASC) 5 MG tablet; Take 1 tablet (5 mg total)  by mouth daily.  Dispense: 90 tablet; Refill: 1 - CBC with Differential/Platelet; Future - Comprehensive metabolic panel; Future  2. Other specified hypothyroidism Continue with synthroid 55mcg daily - TSH; Future  3. MULTIPLE SCLEROSIS Has been stable and without new sx.  4. Hyperlipidemia, unspecified hyperlipidemia type Continue with lipitor 20mg  daily - Lipid panel; Future  5. Gout, unspecified cause, unspecified chronicity, unspecified site No flares since starting allopurinol, continue with 200mg  daily dosing.  6. Hyperglycemia - Hemoglobin A1c; Future  7. Screening for colon cancer - Ambulatory referral to Gastroenterology  8. Indigestion Advised monitoring foods and finding triggers for chest symptoms. He had some concern with gluten tolerance, so will add celiac panel as well. If sx not improving with dietary restriction, advised to try omeprazole 20mg  daily prior to dinner. - Ambulatory referral to Gastroenterology - Celiac Panel 10  9. Chest pressure ekg stable from prior, no acute change, sinus rythym. - EKG 12-Lead    Return in about 3 months (around 11/12/2020) for blood pressure recheck.     Micheline Rough, MD

## 2020-08-15 NOTE — Addendum Note (Signed)
Addended by: Marrion Coy on: 08/15/2020 09:42 AM   Modules accepted: Orders

## 2020-08-15 NOTE — Addendum Note (Signed)
Addended by: Marrion Coy on: 08/15/2020 09:53 AM   Modules accepted: Orders

## 2020-08-15 NOTE — Patient Instructions (Addendum)
If chest pressure symptoms continue - and you can't find obvious trigger, then I would add omeprazole 20mg  daily (30 minutes prior to dinner) to see if this helps.   Gastroesophageal Reflux Disease, Adult  Gastroesophageal reflux (GER) happens when acid from the stomach flows up into the tube that connects the mouth and the stomach (esophagus). Normally, food travels down the esophagus and stays in the stomach to be digested. With GER, food and stomach acid sometimes move back up into the esophagus. You may have a disease called gastroesophageal reflux disease (GERD) if the reflux:  Happens often.  Causes frequent or very bad symptoms.  Causes problems such as damage to the esophagus. When this happens, the esophagus becomes sore and swollen. Over time, GERD can make small holes (ulcers) in the lining of the esophagus. What are the causes? This condition is caused by a problem with the muscle between the esophagus and the stomach. When this muscle is weak or not normal, it does not close properly to keep food and acid from coming back up from the stomach. The muscle can be weak because of:  Tobacco use.  Pregnancy.  Having a certain type of hernia (hiatal hernia).  Alcohol use.  Certain foods and drinks, such as coffee, chocolate, onions, and peppermint. What increases the risk?  Being overweight.  Having a disease that affects your connective tissue.  Taking NSAIDs, such a ibuprofen. What are the signs or symptoms?  Heartburn.  Difficult or painful swallowing.  The feeling of having a lump in the throat.  A bitter taste in the mouth.  Bad breath.  Having a lot of saliva.  Having an upset or bloated stomach.  Burping.  Chest pain. Different conditions can cause chest pain. Make sure you see your doctor if you have chest pain.  Shortness of breath or wheezing.  A long-term cough or a cough at night.  Wearing away of the surface of teeth (tooth  enamel).  Weight loss. How is this treated?  Making changes to your diet.  Taking medicine.  Having surgery. Treatment will depend on how bad your symptoms are. Follow these instructions at home: Eating and drinking  Follow a diet as told by your doctor. You may need to avoid foods and drinks such as: ? Coffee and tea, with or without caffeine. ? Drinks that contain alcohol. ? Energy drinks and sports drinks. ? Bubbly (carbonated) drinks or sodas. ? Chocolate and cocoa. ? Peppermint and mint flavorings. ? Garlic and onions. ? Horseradish. ? Spicy and acidic foods. These include peppers, chili powder, curry powder, vinegar, hot sauces, and BBQ sauce. ? Citrus fruit juices and citrus fruits, such as oranges, lemons, and limes. ? Tomato-based foods. These include red sauce, chili, salsa, and pizza with red sauce. ? Fried and fatty foods. These include donuts, french fries, potato chips, and high-fat dressings. ? High-fat meats. These include hot dogs, rib eye steak, sausage, ham, and bacon. ? High-fat dairy items, such as whole milk, butter, and cream cheese.  Eat small meals often. Avoid eating large meals.  Avoid drinking large amounts of liquid with your meals.  Avoid eating meals during the 2-3 hours before bedtime.  Avoid lying down right after you eat.  Do not exercise right after you eat.   Lifestyle  Do not smoke or use any products that contain nicotine or tobacco. If you need help quitting, ask your doctor.  Try to lower your stress. If you need help doing this, ask  your doctor.  If you are overweight, lose an amount of weight that is healthy for you. Ask your doctor about a safe weight loss goal.   General instructions  Pay attention to any changes in your symptoms.  Take over-the-counter and prescription medicines only as told by your doctor.  Do not take aspirin, ibuprofen, or other NSAIDs unless your doctor says it is okay.  Wear loose clothes. Do not  wear anything tight around your waist.  Raise (elevate) the head of your bed about 6 inches (15 cm). You may need to use a wedge to do this.  Avoid bending over if this makes your symptoms worse.  Keep all follow-up visits. Contact a doctor if:  You have new symptoms.  You lose weight and you do not know why.  You have trouble swallowing or it hurts to swallow.  You have wheezing or a cough that keeps happening.  You have a hoarse voice.  Your symptoms do not get better with treatment. Get help right away if:  You have sudden pain in your arms, neck, jaw, teeth, or back.  You suddenly feel sweaty, dizzy, or light-headed.  You have chest pain or shortness of breath.  You vomit and the vomit is green, yellow, or black, or it looks like blood or coffee grounds.  You faint.  Your poop (stool) is red, bloody, or black.  You cannot swallow, drink, or eat. These symptoms may represent a serious problem that is an emergency. Do not wait to see if the symptoms will go away. Get medical help right away. Call your local emergency services (911 in the [place] Do not drive yourself to the hospital. Summary  If a person has gastroesophageal reflux disease (GERD), food and stomach acid move back up into the esophagus and cause symptoms or problems such as damage to the esophagus.  Treatment will depend on how bad your symptoms are.  Follow a diet as told by your doctor.  Take all medicines only as told by your doctor. This information is not intended to replace advice given to you by your health care provider. Make sure you discuss any questions you have with your health care provider. Document Revised: 12/14/2019 Document Reviewed: 12/14/2019 Elsevier Patient Education  [place].

## 2020-08-17 LAB — CELIAC PANEL 10
Antigliadin Abs, IgA: 9 units (ref 0–19)
Endomysial IgA: NEGATIVE
Gliadin IgG: 2 units (ref 0–19)
IgA/Immunoglobulin A, Serum: 452 mg/dL — ABNORMAL HIGH (ref 61–437)
Tissue Transglut Ab: 3 U/mL (ref 0–5)
Transglutaminase IgA: <2 U/mL (ref 0–3)

## 2020-08-22 NOTE — Addendum Note (Signed)
Addended by: Agnes Lawrence on: 08/22/2020 04:19 PM   Modules accepted: Orders

## 2020-09-07 ENCOUNTER — Other Ambulatory Visit: Payer: Self-pay | Admitting: Family Medicine

## 2020-09-14 ENCOUNTER — Other Ambulatory Visit: Payer: Self-pay | Admitting: Family Medicine

## 2020-10-20 ENCOUNTER — Ambulatory Visit (INDEPENDENT_AMBULATORY_CARE_PROVIDER_SITE_OTHER): Payer: Medicare Other

## 2020-10-20 ENCOUNTER — Other Ambulatory Visit: Payer: Self-pay

## 2020-10-20 VITALS — BP 122/62 | HR 64 | Temp 98.0°F | Ht 70.0 in | Wt 160.0 lb

## 2020-10-20 DIAGNOSIS — Z1211 Encounter for screening for malignant neoplasm of colon: Secondary | ICD-10-CM | POA: Diagnosis not present

## 2020-10-20 DIAGNOSIS — Z0000 Encounter for general adult medical examination without abnormal findings: Secondary | ICD-10-CM

## 2020-10-20 NOTE — Patient Instructions (Signed)
[name] , Thank you for taking time to come for your Medicare Wellness Visit. I appreciate your ongoing commitment to your health goals. Please review the following plan we discussed and let me know if I can assist you in the future.   Screening recommendations/referrals: Colonoscopy: scheduled 11/08/2020 Recommended yearly ophthalmology/optometry visit for glaucoma screening and checkup Recommended yearly dental visit for hygiene and checkup  Vaccinations: Influenza vaccine: current due in the fall 2022 Pneumococcal vaccine: completed series Tdap vaccine: due upon injury Shingles vaccine: obtain local pharmacy   Advanced directives: will provide copies   Conditions/risks identified: none   Next appointment: none   Preventive Care 67 Years and Older, Male Preventive care refers to lifestyle choices and visits with your health care provider that can promote health and wellness. What does preventive care include?  A yearly physical exam. This is also called an annual well check.  Dental exams once or twice a year.  Routine eye exams. Ask your health care provider how often you should have your eyes checked.  Personal lifestyle choices, including:  Daily care of your teeth and gums.  Regular physical activity.  Eating a healthy diet.  Avoiding tobacco and drug use.  Limiting alcohol use.  Practicing safe sex.  Taking low doses of aspirin every day.  Taking vitamin and mineral supplements as recommended by your health care provider. What happens during an annual well check? The services and screenings done by your health care provider during your annual well check will depend on your age, overall health, lifestyle risk factors, and family history of disease. Counseling  Your health care provider may ask you questions about your:  Alcohol use.  Tobacco use.  Drug use.  Emotional well-being.  Home and relationship well-being.  Sexual activity.  Eating  habits.  History of falls.  Memory and ability to understand (cognition).  Work and work Statistician. Screening  You may have the following tests or measurements:  Height, weight, and BMI.  Blood pressure.  Lipid and cholesterol levels. These may be checked every 5 years, or more frequently if you are over 67 years old.  Skin check.  Lung cancer screening. You may have this screening every year starting at age 67 if you have a 30-pack-year history of smoking and currently smoke or have quit within the past 15 years.  Fecal occult blood test (FOBT) of the stool. You may have this test every year starting at age 67.  Flexible sigmoidoscopy or colonoscopy. You may have a sigmoidoscopy every 5 years or a colonoscopy every 10 years starting at age 67.  Prostate cancer screening. Recommendations will vary depending on your family history and other risks.  Hepatitis C blood test.  Hepatitis B blood test.  Sexually transmitted disease (STD) testing.  Diabetes screening. This is done by checking your blood sugar (glucose) after you have not eaten for a while (fasting). You may have this done every 1-3 years.  Abdominal aortic aneurysm (AAA) screening. You may need this if you are a current or former smoker.  Osteoporosis. You may be screened starting at age 67 if you are at high risk. Talk with your health care provider about your test results, treatment options, and if necessary, the need for more tests. Vaccines  Your health care provider may recommend certain vaccines, such as:  Influenza vaccine. This is recommended every year.  Tetanus, diphtheria, and acellular pertussis (Tdap, Td) vaccine. You may need a Td booster every 10 years.  Zoster vaccine.  You may need this after age 67.  Pneumococcal 13-valent conjugate (PCV13) vaccine. One dose is recommended after age 67.  Pneumococcal polysaccharide (PPSV23) vaccine. One dose is recommended after age 67. Talk to your health  care provider about which screenings and vaccines you need and how often you need them. This information is not intended to replace advice given to you by your health care provider. Make sure you discuss any questions you have with your health care provider. Document Released: 07/01/2015 Document Revised: 02/22/2016 Document Reviewed: 04/05/2015 Elsevier Interactive Patient Education  2017 [place] Prevention in the Home Falls can cause injuries. They can happen to people of all ages. There are many things you can do to make your home safe and to help prevent falls. What can I do on the outside of my home?  Regularly fix the edges of walkways and driveways and fix any cracks.  Remove anything that might make you trip as you walk through a door, such as a raised step or threshold.  Trim any bushes or trees on the path to your home.  Use bright outdoor lighting.  Clear any walking paths of anything that might make someone trip, such as rocks or tools.  Regularly check to see if handrails are loose or broken. Make sure that both sides of any steps have handrails.  Any raised decks and porches should have guardrails on the edges.  Have any leaves, snow, or ice cleared regularly.  Use sand or salt on walking paths during winter.  Clean up any spills in your garage right away. This includes oil or grease spills. What can I do in the bathroom?  Use night lights.  Install grab bars by the toilet and in the tub and shower. Do not use towel bars as grab bars.  Use non-skid mats or decals in the tub or shower.  If you need to sit down in the shower, use a plastic, non-slip stool.  Keep the floor dry. Clean up any water that spills on the floor as soon as it happens.  Remove soap buildup in the tub or shower regularly.  Attach bath mats securely with double-sided non-slip rug tape.  Do not have throw rugs and other things on the floor that can make you trip. What can I do  in the bedroom?  Use night lights.  Make sure that you have a light by your bed that is easy to reach.  Do not use any sheets or blankets that are too big for your bed. They should not hang down onto the floor.  Have a firm chair that has side arms. You can use this for support while you get dressed.  Do not have throw rugs and other things on the floor that can make you trip. What can I do in the kitchen?  Clean up any spills right away.  Avoid walking on wet floors.  Keep items that you use a lot in easy-to-reach places.  If you need to reach something above you, use a strong step stool that has a grab bar.  Keep electrical cords out of the way.  Do not use floor polish or wax that makes floors slippery. If you must use wax, use non-skid floor wax.  Do not have throw rugs and other things on the floor that can make you trip. What can I do with my stairs?  Do not leave any items on the stairs.  Make sure that there are handrails on  both sides of the stairs and use them. Fix handrails that are broken or loose. Make sure that handrails are as long as the stairways.  Check any carpeting to make sure that it is firmly attached to the stairs. Fix any carpet that is loose or worn.  Avoid having throw rugs at the top or bottom of the stairs. If you do have throw rugs, attach them to the floor with carpet tape.  Make sure that you have a light switch at the top of the stairs and the bottom of the stairs. If you do not have them, ask someone to add them for you. What else can I do to help prevent falls?  Wear shoes that:  Do not have high heels.  Have rubber bottoms.  Are comfortable and fit you well.  Are closed at the toe. Do not wear sandals.  If you use a stepladder:  Make sure that it is fully opened. Do not climb a closed stepladder.  Make sure that both sides of the stepladder are locked into place.  Ask someone to hold it for you, if possible.  Clearly mark  and make sure that you can see:  Any grab bars or handrails.  First and last steps.  Where the edge of each step is.  Use tools that help you move around (mobility aids) if they are needed. These include:  Canes.  Walkers.  Scooters.  Crutches.  Turn on the lights when you go into a dark area. Replace any light bulbs as soon as they burn out.  Set up your furniture so you have a clear path. Avoid moving your furniture around.  If any of your floors are uneven, fix them.  If there are any pets around you, be aware of where they are.  Review your medicines with your doctor. Some medicines can make you feel dizzy. This can increase your chance of falling. Ask your doctor what other things that you can do to help prevent falls. This information is not intended to replace advice given to you by your health care provider. Make sure you discuss any questions you have with your health care provider. Document Released: 03/31/2009 Document Revised: 11/10/2015 Document Reviewed: 07/09/2014 Elsevier Interactive Patient Education  2017 [employer].

## 2020-10-20 NOTE — Progress Notes (Signed)
Subjective:   Adam Oliver is a 65 y.o. male who presents for an Initial Medicare Annual Wellness Visit.  Review of Systems    n/a       Objective:    There were no vitals filed for this visit. There is no height or weight on file to calculate BMI.  Advanced Directives 07/05/2018 06/18/2017  Does Patient Have a Medical Advance Directive? No No  Would patient like information on creating a medical advance directive? No - Patient declined -    Current Medications (verified) Outpatient Encounter Medications as of 10/20/2020  Medication Sig  . allopurinol (ZYLOPRIM) 100 MG tablet TAKE 1 TABLET BY MOUTH TWICE A DAY  . amLODipine (NORVASC) 5 MG tablet Take 1 tablet (5 mg total) by mouth daily.  Marland Kitchen atorvastatin (LIPITOR) 20 MG tablet TAKE 1 TABLET BY MOUTH DAILY AT 6:00PM.  . levothyroxine (SYNTHROID) 50 MCG tablet TAKE 1 TABLET BY MOUTH 30 MINUTES BEFORE BREAKFAST  . telmisartan (MICARDIS) 40 MG tablet Take 1 tablet (40 mg total) by mouth daily.   No facility-administered encounter medications on file as of 10/20/2020.    Allergies (verified) Patient has no known allergies.   History: Past Medical History:  Diagnosis Date  . Gout   . Hyperlipidemia   . Hypertension   . Hypothyroidism   . Multiple sclerosis ([place])    Past Surgical History:  Procedure Laterality Date  . OTHER SURGICAL HISTORY     right thumb tendon surgery   Family History  Problem Relation Age of Onset  . Pancreatic cancer Mother   . Throat cancer Father   . Prostate cancer Brother 55  . Cancer Paternal Grandmother 54       unknown type  . Heart disease Paternal Grandfather   . Skin cancer Paternal Grandfather    Social History   Socioeconomic History  . Marital status: Married    Spouse name: Not on file  . Number of children: 2  . Years of education: Law  . Highest education level: Not on file  Occupational History  . Occupation: attorney  Tobacco Use  . Smoking status: Never Smoker  .  Smokeless tobacco: Never Used  Substance and Sexual Activity  . Alcohol use: Yes    Comment: every other day - 2 drinks  . Drug use: No  . Sexual activity: Not on file  Other Topics Concern  . Not on file  Social History Narrative   Patient lives at home alone. Dating.   Caffeine Use: 1 cup every other day   [employer]   No regular exercise, diet is so so   [employer].   Social Determinants of Health   Financial Resource Strain: Not on file  Food Insecurity: Not on file  Transportation Needs: Not on file  Physical Activity: Not on file  Stress: Not on file  Social Connections: Not on file    Tobacco Counseling Counseling given: Not Answered   Clinical Intake:                 Diabetic?no         Activities of Daily Living In your present state of health, do you have any difficulty performing the following activities: 08/15/2020  Hearing? N  Vision? N  Difficulty concentrating or making decisions? N  Walking or climbing stairs? N  Dressing or bathing? N  Doing errands, shopping? N  Some recent data might be hidden    Patient Care Team: Caren Macadam,  MD as PCP - General (Family Medicine) Valinda Party, MD (Rheumatology)  Indicate any recent Medical Services you may have received from other than [facility] providers in the past year (date may be approximate).     Assessment:   This is a routine wellness examination for Adam Oliver.  Hearing/Vision screen No exam data present  Dietary issues and exercise activities discussed:    Goals Addressed   None    Depression Screen PHQ 2/9 Scores 08/15/2020 06/29/2019 05/13/2018  PHQ - 2 Score 0 0 0  PHQ- 9 Score - - 0    Fall Risk Fall Risk  08/15/2020 06/29/2019  Falls in the past year? 0 0  Number falls in past yr: 0 0  Injury with Fall? 0 0    FALL RISK PREVENTION PERTAINING TO THE HOME:  Any stairs in or around the home? Yes  If so, are there any without handrails? Yes  Home free  of loose throw rugs in walkways, pet beds, electrical cords, etc? Yes  Adequate lighting in your home to reduce risk of falls? Yes    ASSISTIVE DEVICES UTILIZED TO PREVENT FALLS:  Life alert? No  Use of a cane, walker or w/c? No  Grab bars in the bathroom? Yes  Shower chair or bench in shower? Yes  Elevated toilet seat or a handicapped toilet? Yes   TIMED UP AND GO:  Was the test performed? Yes .  Length of time to ambulate 10 feet: 6 sec.   Gait steady and fast without use of assistive device  Cognitive Function:   Normal cognitive status assessed by direct observation by this Nurse Health Advisor. No abnormalities found.        Immunizations Immunization History  Administered Date(s) Administered  . Fluad Quad(high Dose 65+) 06/29/2019  . Influenza Split 03/30/2011  . Influenza Whole 05/20/2007, 03/20/2010  . Influenza,inj,Quad PF,6+ Mos 05/06/2013, 07/20/2014, 05/16/2015, 05/13/2018  . PFIZER(Purple Top)SARS-COV-2 Vaccination 07/08/2019, 07/28/2019, 04/03/2020  . Pneumococcal Conjugate-13 12/22/2018  . Pneumococcal Polysaccharide-23 01/07/2020  . Td 06/18/1998, 08/31/2009  . Zoster 05/31/2015    TDAP status: Due, Education has been provided regarding the importance of this vaccine. Advised may receive this vaccine at local pharmacy or Health Dept. Aware to provide a copy of the vaccination record if obtained from local pharmacy or Health Dept. Verbalized acceptance and understanding.  Flu Vaccine status: Up to date  Pneumococcal vaccine status: Up to date  Covid-19 vaccine status: Completed vaccines  Qualifies for Shingles Vaccine? Yes   Zostavax completed No   Shingrix Completed?: No.    Education has been provided regarding the importance of this vaccine. Patient has been advised to call insurance company to determine out of pocket expense if they have not yet received this vaccine. Advised may also receive vaccine at local pharmacy or Health Dept. Verbalized  acceptance and understanding.  Screening Tests Health Maintenance  Topic Date Due  . COLONOSCOPY (Pts 45-25yrs Insurance coverage will need to be confirmed)  08/06/2016  . TETANUS/TDAP  09/01/2019  . INFLUENZA VACCINE  01/16/2021  . COVID-19 Vaccine  Completed  . PNA vac Low Risk Adult  Completed  . Hepatitis C Screening  Addressed  . HPV VACCINES  Aged Out    Health Maintenance  Health Maintenance Due  Topic Date Due  . COLONOSCOPY (Pts 45-61yrs Insurance coverage will need to be confirmed)  08/06/2016  . TETANUS/TDAP  09/01/2019    Colorectal cancer screening: Referral to GI placed 10/20/2020. Pt aware the office will  call re: appt.  Lung Cancer Screening: (Low Dose CT Chest recommended if Age 24-80 years, 30 pack-year currently smoking OR have quit w/in 15years.) does not qualify.   Lung Cancer Screening Referral: n/a  Additional Screening:  Hepatitis C Screening: does qualify  Vision Screening: Recommended annual ophthalmology exams for early detection of glaucoma and other disorders of the eye. Is the patient up to date with their annual eye exam?  Yes  Who is the provider or what is the name of the office in which the patient attends annual eye exams? [facility]  If pt is not established with a provider, would they like to be referred to a provider to establish care? No .   Dental Screening: Recommended annual dental exams for proper oral hygiene  Community Resource Referral / Chronic Care Management: CRR required this visit?  No   CCM required this visit?  No      Plan:     I have personally reviewed and noted the following in the patient's chart:   . Medical and social history . Use of alcohol, tobacco or illicit drugs  . Current medications and supplements including opioid prescriptions. Patient is not currently taking opioid prescriptions. . Functional ability and status . Nutritional status . Physical activity . Advanced directives . List of  other physicians . Hospitalizations, surgeries, and ER visits in previous 12 months . Vitals . Screenings to include cognitive, depression, and falls . Referrals and appointments  In addition, I have reviewed and discussed with patient certain preventive protocols, quality metrics, and best practice recommendations. A written personalized care plan for preventive services as well as general preventive health recommendations were provided to patient.     Randel Pigg, LPN   11/19/8754   Nurse Notes: none

## 2020-10-25 ENCOUNTER — Telehealth: Payer: Self-pay | Admitting: *Deleted

## 2020-10-25 NOTE — Telephone Encounter (Signed)
Unable to reach patient for virtual pre visit.  Message left asking patient to return call.

## 2020-10-26 NOTE — Telephone Encounter (Signed)
No return call from patient. Coloscopy canceled and missed appointment letter mailed.

## 2020-11-01 ENCOUNTER — Ambulatory Visit (AMBULATORY_SURGERY_CENTER): Payer: Medicare Other | Admitting: *Deleted

## 2020-11-01 ENCOUNTER — Other Ambulatory Visit: Payer: Self-pay

## 2020-11-01 VITALS — Ht 69.0 in | Wt 165.0 lb

## 2020-11-01 DIAGNOSIS — Z86010 Personal history of colonic polyps: Secondary | ICD-10-CM

## 2020-11-01 MED ORDER — PEG 3350-KCL-NA BICARB-NACL 420 G PO SOLR: 4000.0000 mL | Freq: Once | ORAL | 0 refills | Status: AC

## 2020-11-01 NOTE — Progress Notes (Signed)

## 2020-11-08 ENCOUNTER — Other Ambulatory Visit: Payer: Self-pay

## 2020-11-08 ENCOUNTER — Ambulatory Visit (AMBULATORY_SURGERY_CENTER): Payer: Medicare Other | Admitting: Gastroenterology

## 2020-11-08 ENCOUNTER — Encounter: Payer: Self-pay | Admitting: Gastroenterology

## 2020-11-08 VITALS — BP 121/88 | HR 60 | Temp 97.1°F | Resp 121 | Ht 69.0 in | Wt 165.0 lb

## 2020-11-08 DIAGNOSIS — D124 Benign neoplasm of descending colon: Secondary | ICD-10-CM | POA: Diagnosis not present

## 2020-11-08 DIAGNOSIS — K635 Polyp of colon: Secondary | ICD-10-CM | POA: Diagnosis not present

## 2020-11-08 DIAGNOSIS — D120 Benign neoplasm of cecum: Secondary | ICD-10-CM

## 2020-11-08 DIAGNOSIS — D123 Benign neoplasm of transverse colon: Secondary | ICD-10-CM | POA: Diagnosis not present

## 2020-11-08 DIAGNOSIS — K621 Rectal polyp: Secondary | ICD-10-CM

## 2020-11-08 DIAGNOSIS — Z86010 Personal history of colonic polyps: Secondary | ICD-10-CM | POA: Diagnosis present

## 2020-11-08 MED ORDER — SODIUM CHLORIDE 0.9 % IV SOLN
500.0000 mL | Freq: Once | INTRAVENOUS | Status: DC
Start: 2020-11-08 — End: 2020-11-08

## 2020-11-08 NOTE — Patient Instructions (Signed)
Handouts given:  Polyps, hemorrhoids Resume previous diet Continue current medications Await pathology results DO NOT take ibuprofen, naproxen, aleve, motrin, good powder or any other non steroidal anti inflammatory drugs for the next  2 wks  YOU HAD AN ENDOSCOPIC PROCEDURE TODAY AT [place]:   Refer to the procedure report that was given to you for any specific questions about what was found during the examination.  If the procedure report does not answer your questions, please call your gastroenterologist to clarify.  If you requested that your care partner not be given the details of your procedure findings, then the procedure report has been included in a sealed envelope for you to review at your convenience later.  YOU SHOULD EXPECT: Some feelings of bloating in the abdomen. Passage of more gas than usual.  Walking can help get rid of the air that was put into your GI tract during the procedure and reduce the bloating. If you had a lower endoscopy (such as a colonoscopy or flexible sigmoidoscopy) you may notice spotting of blood in your stool or on the toilet paper. If you underwent a bowel prep for your procedure, you may not have a normal bowel movement for a few days.  Please Note:  You might notice some irritation and congestion in your nose or some drainage.  This is from the oxygen used during your procedure.  There is no need for concern and it should clear up in a day or so.  SYMPTOMS TO REPORT IMMEDIATELY:   Following lower endoscopy (colonoscopy or flexible sigmoidoscopy):  Excessive amounts of blood in the stool  Significant tenderness or worsening of abdominal pains  Swelling of the abdomen that is new, acute  Fever of 100F or higher  For urgent or emergent issues, a gastroenterologist can be reached at any hour by calling [phone]. Do not use MyChart messaging for urgent concerns.   DIET:  We do recommend a small meal at first, but then you may  proceed to your regular diet.  Drink plenty of fluids but you should avoid alcoholic beverages for 24 hours.  ACTIVITY:  You should plan to take it easy for the rest of today and you should NOT DRIVE or use heavy machinery until tomorrow (because of the sedation medicines used during the test).    FOLLOW UP: Our staff will call the number listed on your records 48-72 hours following your procedure to check on you and address any questions or concerns that you may have regarding the information given to you following your procedure. If we do not reach you, we will leave a message.  We will attempt to reach you two times.  During this call, we will ask if you have developed any symptoms of COVID 19. If you develop any symptoms (ie: fever, flu-like symptoms, shortness of breath, cough etc.) before then, please call [phone].  If you test positive for Covid 19 in the 2 weeks post procedure, please call and report this information to [place].    If any biopsies were taken you will be contacted by phone or by letter within the next 1-3 weeks.  Please call us at [phone] if you have not heard about the biopsies in 3 weeks.   SIGNATURES/CONFIDENTIALITY: You and/or your care partner have signed paperwork which will be entered into your electronic medical record.  These signatures attest to the fact that that the information above on your After Visit Summary has been reviewed and is  understood.  Full responsibility of the confidentiality of this discharge information lies with you and/or your care-partner. 

## 2020-11-08 NOTE — Progress Notes (Signed)
Called to room to assist during endoscopic procedure.  Patient ID and intended procedure confirmed with present staff. Received instructions for my participation in the procedure from the performing physician.  Clip placed x 2 at cecal polyp site.  Lot # [id number]

## 2020-11-08 NOTE — Op Note (Signed)
[place] Patient Name: Adam Oliver Procedure Date: 11/08/2020 9:05 AM MRN: [id number] Endoscopist: Mauri Pole , MD Age: 65 Referring MD:  Date of Birth: 12/30/1955 Gender: Male Account #: [id number] Procedure:                Colonoscopy Indications:              Screening for colorectal malignant neoplasm Medicines:                Monitored Anesthesia Care Procedure:                Pre-Anesthesia Assessment:                           - Prior to the procedure, a History and Physical                            was performed, and patient medications and                            allergies were reviewed. The patient's tolerance of                            previous anesthesia was also reviewed. The risks                            and benefits of the procedure and the sedation                            options and risks were discussed with the patient.                            All questions were answered, and informed consent                            was obtained. Prior Anticoagulants: The patient has                            taken no previous anticoagulant or antiplatelet                            agents. ASA Grade Assessment: II - A patient with                            mild systemic disease. After reviewing the risks                            and benefits, the patient was deemed in                            satisfactory condition to undergo the procedure.                           After obtaining informed consent, the colonoscope  was passed under direct vision. Throughout the                            procedure, the patient's blood pressure, pulse, and                            oxygen saturations were monitored continuously. The                            Colonoscope was introduced through the anus and                            advanced to the the cecum, identified by                            appendiceal orifice and  ileocecal valve. The                            colonoscopy was performed without difficulty. The                            patient tolerated the procedure well. The quality                            of the bowel preparation was excellent. The                            ileocecal valve, appendiceal orifice, and rectum                            were photographed. Scope In: 9:21:13 AM Scope Out: 9:45:03 AM Scope Withdrawal Time: 0 hours 17 minutes 23 seconds  Total Procedure Duration: 0 hours 23 minutes 50 seconds  Findings:                 The perianal and digital rectal examinations were                            normal.                           A 18 mm polyp was found in the cecum. The polyp was                            semi-pedunculated. The polyp was removed with a hot                            snare. Resection and retrieval were complete. To                            prevent bleeding after the polypectomy, two                            hemostatic clips were successfully placed (MR  conditional). There was no bleeding at the end of                            the procedure.                           Two sessile polyps were found in the descending                            colon and transverse colon. The polyps were 5 to 7                            mm in size. These polyps were removed with a cold                            snare. Resection and retrieval were complete.                           A less than 1 mm polyp was found in the rectum. The                            polyp was sessile. The polyp was removed with a                            cold biopsy forceps. Resection and retrieval were                            complete.                           Non-bleeding external and internal hemorrhoids were                            found during retroflexion. The hemorrhoids were                            medium-sized. Complications:             No immediate complications. Estimated Blood Loss:     Estimated blood loss was minimal. Impression:               - One 18 mm polyp in the cecum, removed with a hot                            snare. Resected and retrieved. Clips (MR                            conditional) were placed.                           - Two 5 to 7 mm polyps in the descending colon and                            in the transverse colon, removed with a cold snare.  Resected and retrieved.                           - One less than 1 mm polyp in the rectum, removed                            with a cold biopsy forceps. Resected and retrieved.                           - Non-bleeding external and internal hemorrhoids. Recommendation:           - Patient has a contact number available for                            emergencies. The signs and symptoms of potential                            delayed complications were discussed with the                            patient. Return to normal activities tomorrow.                            Written discharge instructions were provided to the                            patient.                           - Resume previous diet.                           - Continue present medications.                           - Await pathology results.                           - Repeat colonoscopy in 3 years for surveillance                            based on pathology results.                           - No ibuprofen, naproxen, or other non-steroidal                            anti-inflammatory drugs for 2 weeks. Mauri Pole, MD 11/08/2020 9:52:37 AM This report has been signed electronically.

## 2020-11-08 NOTE — Progress Notes (Signed)
VS-CW  Pt's states no medical or surgical changes since previsit or office visit.  

## 2020-11-10 ENCOUNTER — Telehealth: Payer: Self-pay | Admitting: *Deleted

## 2020-11-10 NOTE — Telephone Encounter (Signed)
  Follow up Call-  Call back number 11/08/2020  Post procedure Call Back phone  # [phone]  Permission to leave phone message Yes  Some recent data might be hidden     Patient questions:  Do you have a fever, pain , or abdominal swelling? No. Pain Score  0 *  Have you tolerated food without any problems? Yes.    Have you been able to return to your normal activities? Yes.    Do you have any questions about your discharge instructions: Diet   No. Medications  No. Follow up visit  No.  Do you have questions or concerns about your Care? No.  Actions: * If pain score is 4 or above: No action needed, pain <4.  1. Have you developed a fever since your procedure? no  2.   Have you had an respiratory symptoms (SOB or cough) since your procedure? no  3.   Have you tested positive for COVID 19 since your procedure no  4.   Have you had any family members/close contacts diagnosed with the COVID 19 since your procedure?  no   If yes to any of these questions please route to Joylene John, RN and Joella Prince, RN

## 2020-11-17 ENCOUNTER — Encounter: Payer: Self-pay | Admitting: Gastroenterology

## 2020-11-23 ENCOUNTER — Other Ambulatory Visit: Payer: Self-pay

## 2020-11-23 ENCOUNTER — Other Ambulatory Visit (INDEPENDENT_AMBULATORY_CARE_PROVIDER_SITE_OTHER): Payer: Medicare Other

## 2020-11-23 LAB — COMPREHENSIVE METABOLIC PANEL
ALT: 26 U/L (ref 0–53)
AST: 22 U/L (ref 0–37)
Albumin: 4.3 g/dL (ref 3.5–5.2)
Alkaline Phosphatase: 71 U/L (ref 39–117)
BUN: 24 mg/dL — ABNORMAL HIGH (ref 6–23)
CO2: 25 mEq/L (ref 19–32)
Calcium: 9.3 mg/dL (ref 8.4–10.5)
Chloride: 108 mEq/L (ref 96–112)
Creatinine, Ser: 1.29 mg/dL (ref 0.40–1.50)
GFR: 57.57 mL/min — ABNORMAL LOW (ref >60.00)
Glucose, Bld: 112 mg/dL — ABNORMAL HIGH (ref 70–99)
Potassium: 4.4 mEq/L (ref 3.5–5.1)
Sodium: 142 mEq/L (ref 135–145)
Total Bilirubin: 1.1 mg/dL (ref 0.2–1.2)
Total Protein: 7.1 g/dL (ref 6.0–8.3)

## 2020-12-12 ENCOUNTER — Other Ambulatory Visit: Payer: Self-pay | Admitting: Family Medicine

## 2020-12-12 DIAGNOSIS — E785 Hyperlipidemia, unspecified: Secondary | ICD-10-CM

## 2021-01-09 ENCOUNTER — Other Ambulatory Visit: Payer: Self-pay | Admitting: Family Medicine

## 2021-01-09 DIAGNOSIS — I10 Essential (primary) hypertension: Secondary | ICD-10-CM

## 2021-02-10 ENCOUNTER — Other Ambulatory Visit: Payer: Self-pay | Admitting: Family Medicine

## 2021-02-10 DIAGNOSIS — I10 Essential (primary) hypertension: Secondary | ICD-10-CM

## 2021-03-05 ENCOUNTER — Other Ambulatory Visit: Payer: Self-pay | Admitting: Family Medicine

## 2021-03-05 DIAGNOSIS — E785 Hyperlipidemia, unspecified: Secondary | ICD-10-CM

## 2021-03-07 ENCOUNTER — Other Ambulatory Visit: Payer: Self-pay | Admitting: Family Medicine

## 2021-04-12 ENCOUNTER — Other Ambulatory Visit: Payer: Self-pay

## 2021-04-13 ENCOUNTER — Ambulatory Visit (INDEPENDENT_AMBULATORY_CARE_PROVIDER_SITE_OTHER): Payer: Medicare Other | Admitting: Family Medicine

## 2021-04-13 ENCOUNTER — Encounter: Payer: Self-pay | Admitting: Family Medicine

## 2021-04-13 VITALS — BP 132/90 | HR 77 | Temp 97.8°F | Wt 173.0 lb

## 2021-04-13 DIAGNOSIS — J069 Acute upper respiratory infection, unspecified: Secondary | ICD-10-CM

## 2021-04-13 MED ORDER — AZITHROMYCIN 250 MG PO TABS: ORAL_TABLET | ORAL | 0 refills | Status: DC

## 2021-04-13 NOTE — Progress Notes (Signed)
   Subjective:    Patient ID: Zigmund Daniel, male    DOB: 03/01/1956, 65 y.o.   MRN: [id number]  HPI Here for 10 days of a dry cough and chest congestion. No fever or chest pain or SOB. No body aches or NVD. He tested negative for the Covid-19 virus about a week ago. He has taken some Mucinex.    Review of Systems  Constitutional: Negative.   HENT: Negative.    Eyes: Negative.   Respiratory:  Positive for cough. Negative for shortness of breath and wheezing.   Cardiovascular: Negative.       Objective:   Physical Exam Constitutional:      Appearance: Normal appearance. He is not ill-appearing.  Cardiovascular:     Rate and Rhythm: Normal rate and regular rhythm.     Pulses: Normal pulses.     Heart sounds: Normal heart sounds.  Pulmonary:     Effort: Pulmonary effort is normal.     Breath sounds: Normal breath sounds.  Lymphadenopathy:     Cervical: No cervical adenopathy.  Neurological:     Mental Status: He is alert.          Assessment & Plan:  Atypical URI. Treat with a Zpack. Recheck as needed.  Alysia Penna, MD

## 2021-05-10 ENCOUNTER — Other Ambulatory Visit: Payer: Self-pay | Admitting: Family Medicine

## 2021-05-10 DIAGNOSIS — I10 Essential (primary) hypertension: Secondary | ICD-10-CM

## 2021-05-30 ENCOUNTER — Other Ambulatory Visit: Payer: Self-pay | Admitting: Family Medicine

## 2021-05-30 DIAGNOSIS — E785 Hyperlipidemia, unspecified: Secondary | ICD-10-CM

## 2021-06-13 ENCOUNTER — Other Ambulatory Visit: Payer: Self-pay | Admitting: Family Medicine

## 2021-07-17 ENCOUNTER — Other Ambulatory Visit: Payer: Self-pay

## 2021-07-17 ENCOUNTER — Encounter (INDEPENDENT_AMBULATORY_CARE_PROVIDER_SITE_OTHER): Payer: Medicare Other | Admitting: Ophthalmology

## 2021-07-17 DIAGNOSIS — H353132 Nonexudative age-related macular degeneration, bilateral, intermediate dry stage: Secondary | ICD-10-CM

## 2021-07-17 DIAGNOSIS — H43813 Vitreous degeneration, bilateral: Secondary | ICD-10-CM

## 2021-07-17 DIAGNOSIS — H35033 Hypertensive retinopathy, bilateral: Secondary | ICD-10-CM | POA: Diagnosis not present

## 2021-07-17 DIAGNOSIS — I10 Essential (primary) hypertension: Secondary | ICD-10-CM | POA: Diagnosis not present

## 2021-08-09 ENCOUNTER — Other Ambulatory Visit: Payer: Self-pay | Admitting: Family Medicine

## 2021-08-09 DIAGNOSIS — I10 Essential (primary) hypertension: Secondary | ICD-10-CM

## 2021-08-30 ENCOUNTER — Other Ambulatory Visit: Payer: Self-pay | Admitting: Family Medicine

## 2021-08-30 DIAGNOSIS — E785 Hyperlipidemia, unspecified: Secondary | ICD-10-CM

## 2021-09-08 ENCOUNTER — Other Ambulatory Visit: Payer: Self-pay | Admitting: Family Medicine

## 2021-09-28 ENCOUNTER — Other Ambulatory Visit: Payer: Self-pay | Admitting: *Deleted

## 2021-09-28 DIAGNOSIS — E785 Hyperlipidemia, unspecified: Secondary | ICD-10-CM

## 2021-10-01 ENCOUNTER — Other Ambulatory Visit: Payer: Self-pay | Admitting: Family Medicine

## 2021-10-01 DIAGNOSIS — E785 Hyperlipidemia, unspecified: Secondary | ICD-10-CM

## 2021-10-20 ENCOUNTER — Telehealth: Payer: Self-pay | Admitting: Family Medicine

## 2021-10-20 NOTE — Telephone Encounter (Signed)
Left message for patient to call back and schedule Medicare Annual Wellness Visit (AWV) either virtually or in office. Left  my Adam Oliver number [phone] ? ? ?Last AWV 10/20/20 ? please schedule at anytime with [facility] Nurse Health Advisor 1 or 2 ? ? ? ?

## 2021-10-24 ENCOUNTER — Ambulatory Visit (INDEPENDENT_AMBULATORY_CARE_PROVIDER_SITE_OTHER): Payer: Medicare Other

## 2021-10-24 VITALS — BP 120/70 | HR 79 | Temp 97.9°F | Ht 69.5 in | Wt 175.6 lb

## 2021-10-24 DIAGNOSIS — Z0000 Encounter for general adult medical examination without abnormal findings: Secondary | ICD-10-CM

## 2021-10-24 NOTE — Patient Instructions (Signed)
Adam Oliver , ?Thank you for taking time to come for your Medicare Wellness Visit. I appreciate your ongoing commitment to your health goals. Please review the following plan we discussed and let me know if I can assist you in the future.  ? ?Screening recommendations/referrals: ?Colonoscopy: completed 11/08/2020, due 11/09/2023 ?Recommended yearly ophthalmology/optometry visit for glaucoma screening and checkup ?Recommended yearly dental visit for hygiene and checkup ? ?Vaccinations: ?Influenza vaccine: due 01/16/2022 ?Pneumococcal vaccine: completed 01/07/2020 ?Tdap vaccine: due ?Shingles vaccine: discussed   ?Covid-19:  03/03/2021, 12/02/2020, 04/03/2020, 07/28/2019, 07/08/2019 ? ?Advanced directives: Please bring a copy of your POA (Power of Attorney) and/or Living Will to your next appointment.  ? ? ?Conditions/risks identified: none ? ?Next appointment: Follow up in one year for your annual wellness visit.  ? ?Preventive Care 68 Years and Older, Male ?Preventive care refers to lifestyle choices and visits with your health care provider that can promote health and wellness. ?What does preventive care include? ?A yearly physical exam. This is also called an annual well check. ?Dental exams once or twice a year. ?Routine eye exams. Ask your health care provider how often you should have your eyes checked. ?Personal lifestyle choices, including: ?Daily care of your teeth and gums. ?Regular physical activity. ?Eating a healthy diet. ?Avoiding tobacco and drug use. ?Limiting alcohol use. ?Practicing safe sex. ?Taking low doses of aspirin every day. ?Taking vitamin and mineral supplements as recommended by your health care provider. ?What happens during an annual well check? ?The services and screenings done by your health care provider during your annual well check will depend on your age, overall health, lifestyle risk factors, and family history of disease. ?Counseling  ?Your health care provider may ask you questions about  your: ?Alcohol use. ?Tobacco use. ?Drug use. ?Emotional well-being. ?Home and relationship well-being. ?Sexual activity. ?Eating habits. ?History of falls. ?Memory and ability to understand (cognition). ?Work and work Statistician. ?Screening  ?You may have the following tests or measurements: ?Height, weight, and BMI. ?Blood pressure. ?Lipid and cholesterol levels. These may be checked every 5 years, or more frequently if you are over 34 years old. ?Skin check. ?Lung cancer screening. You may have this screening every year starting at age 62 if you have a 30-pack-year history of smoking and currently smoke or have quit within the past 15 years. ?Fecal occult blood test (FOBT) of the stool. You may have this test every year starting at age 17. ?Flexible sigmoidoscopy or colonoscopy. You may have a sigmoidoscopy every 5 years or a colonoscopy every 10 years starting at age 58. ?Prostate cancer screening. Recommendations will vary depending on your family history and other risks. ?Hepatitis C blood test. ?Hepatitis B blood test. ?Sexually transmitted disease (STD) testing. ?Diabetes screening. This is done by checking your blood sugar (glucose) after you have not eaten for a while (fasting). You may have this done every 1-3 years. ?Abdominal aortic aneurysm (AAA) screening. You may need this if you are a current or former smoker. ?Osteoporosis. You may be screened starting at age 18 if you are at high risk. ?Talk with your health care provider about your test results, treatment options, and if necessary, the need for more tests. ?Vaccines  ?Your health care provider may recommend certain vaccines, such as: ?Influenza vaccine. This is recommended every year. ?Tetanus, diphtheria, and acellular pertussis (Tdap, Td) vaccine. You may need a Td booster every 10 years. ?Zoster vaccine. You may need this after age 61. ?Pneumococcal 13-valent conjugate (PCV13) vaccine. One dose  is recommended after age 15. ?Pneumococcal  polysaccharide (PPSV23) vaccine. One dose is recommended after age 5. ?Talk to your health care provider about which screenings and vaccines you need and how often you need them. ?This information is not intended to replace advice given to you by your health care provider. Make sure you discuss any questions you have with your health care provider. ?Document Released: 07/01/2015 Document Revised: 02/22/2016 Document Reviewed: 04/05/2015 ?Elsevier Interactive Patient Education ? 2017 [place]. ? ?Fall Prevention in the Home ?Falls can cause injuries. They can happen to people of all ages. There are many things you can do to make your home safe and to help prevent falls. ?What can I do on the outside of my home? ?Regularly fix the edges of walkways and driveways and fix any cracks. ?Remove anything that might make you trip as you walk through a door, such as a raised step or threshold. ?Trim any bushes or trees on the path to your home. ?Use bright outdoor lighting. ?Clear any walking paths of anything that might make someone trip, such as rocks or tools. ?Regularly check to see if handrails are loose or broken. Make sure that both sides of any steps have handrails. ?Any raised decks and porches should have guardrails on the edges. ?Have any leaves, snow, or ice cleared regularly. ?Use sand or salt on walking paths during winter. ?Clean up any spills in your garage right away. This includes oil or grease spills. ?What can I do in the bathroom? ?Use night lights. ?Install grab bars by the toilet and in the tub and shower. Do not use towel bars as grab bars. ?Use non-skid mats or decals in the tub or shower. ?If you need to sit down in the shower, use a plastic, non-slip stool. ?Keep the floor dry. Clean up any water that spills on the floor as soon as it happens. ?Remove soap buildup in the tub or shower regularly. ?Attach bath mats securely with double-sided non-slip rug tape. ?Do not have throw rugs and other  things on the floor that can make you trip. ?What can I do in the bedroom? ?Use night lights. ?Make sure that you have a light by your bed that is easy to reach. ?Do not use any sheets or blankets that are too big for your bed. They should not hang down onto the floor. ?Have a firm chair that has side arms. You can use this for support while you get dressed. ?Do not have throw rugs and other things on the floor that can make you trip. ?What can I do in the kitchen? ?Clean up any spills right away. ?Avoid walking on wet floors. ?Keep items that you use a lot in easy-to-reach places. ?If you need to reach something above you, use a strong step stool that has a grab bar. ?Keep electrical cords out of the way. ?Do not use floor polish or wax that makes floors slippery. If you must use wax, use non-skid floor wax. ?Do not have throw rugs and other things on the floor that can make you trip. ?What can I do with my stairs? ?Do not leave any items on the stairs. ?Make sure that there are handrails on both sides of the stairs and use them. Fix handrails that are broken or loose. Make sure that handrails are as long as the stairways. ?Check any carpeting to make sure that it is firmly attached to the stairs. Fix any carpet that is loose or worn. ?Avoid  having throw rugs at the top or bottom of the stairs. If you do have throw rugs, attach them to the floor with carpet tape. ?Make sure that you have a light switch at the top of the stairs and the bottom of the stairs. If you do not have them, ask someone to add them for you. ?What else can I do to help prevent falls? ?Wear shoes that: ?Do not have high heels. ?Have rubber bottoms. ?Are comfortable and fit you well. ?Are closed at the toe. Do not wear sandals. ?If you use a stepladder: ?Make sure that it is fully opened. Do not climb a closed stepladder. ?Make sure that both sides of the stepladder are locked into place. ?Ask someone to hold it for you, if possible. ?Clearly  mark and make sure that you can see: ?Any grab bars or handrails. ?First and last steps. ?Where the edge of each step is. ?Use tools that help you move around (mobility aids) if they are needed. These include: ?

## 2021-10-24 NOTE — Progress Notes (Addendum)
? ?Subjective:  ? Adam Oliver is a 66 y.o. male who presents for Medicare Annual/Subsequent preventive examination. ? ?Review of Systems    ? ?Cardiac Risk Factors include: advanced age (>70mn, >>56women);dyslipidemia;hypertension ? ?   ?Objective:  ?  ?Today's Vitals  ? 10/24/21 04656 ?BP: 120/70  ?Pulse: 79  ?Temp: 97.9 ?F (36.6 ?C)  ?TempSrc: Oral  ?SpO2: 98%  ?Weight: 175 lb 9.6 oz (79.7 kg)  ?Height: 5' 9.5" (1.765 m)  ? ?Body mass index is 25.56 kg/m?. ? ? ?  10/24/2021  ?  8:40 AM 10/20/2020  ? 10:07 AM 07/05/2018  ? 12:08 AM 06/18/2017  ?  6:58 PM  ?Advanced Directives  ?Does Patient Have a Medical Advance Directive? Yes No No No  ?Type of [place] will     ?Copy of [place]? No - copy requested     ?Would patient like information on creating a medical advance directive?  No - Patient declined No - Patient declined   ? ? ?Current Medications (verified) ?Outpatient Encounter Medications as of 10/24/2021  ?Medication Sig  ? allopurinol (ZYLOPRIM) 100 MG tablet TAKE 1 TABLET BY MOUTH TWICE A DAY (Patient taking differently: Takes 1 tab daily)  ? amLODipine (NORVASC) 5 MG tablet TAKE 1 TABLET (5 MG TOTAL) BY MOUTH DAILY.  ? atorvastatin (LIPITOR) 20 MG tablet TAKE 1 TABLET BY MOUTH DAILY AT 6:00PM. (Patient taking differently: Takes 1 tab every other day at 6 pm)  ? levothyroxine (SYNTHROID) 50 MCG tablet TAKE 1 TABLET BY MOUTH 30 MINUTES BEFORE BREAKFAST  ? telmisartan (MICARDIS) 40 MG tablet TAKE 1 TABLET BY MOUTH EVERY DAY  ? azithromycin (ZITHROMAX Z-PAK) 250 MG tablet As directed  ? ?No facility-administered encounter medications on file as of 10/24/2021.  ? ? ?Allergies (verified) ?Patient has no known allergies.  ? ?History: ?Past Medical History:  ?Diagnosis Date  ? Allergy   ? slight   ? Arthritis   ? thumbs  ? Gout   ? Hyperlipidemia   ? Hypertension   ? Hypothyroidism   ? Multiple sclerosis ([place]   ? Neuromuscular disorder ([place]   ?  MS   ? ?Past Surgical History:  ?Procedure Laterality Date  ? COLONOSCOPY    ? OTHER SURGICAL HISTORY    ? right thumb tendon surgery  ? ?Family History  ?Problem Relation Age of Onset  ? Pancreatic cancer Mother   ? Throat cancer Father   ? Prostate cancer Brother 622 ? Esophageal cancer Brother   ? Cancer Paternal Grandmother [phone] ?     unknown type  ? Heart disease Paternal Grandfather   ? Skin cancer Paternal Grandfather   ? Colon cancer Neg Hx   ? Colon polyps Neg Hx   ? Rectal cancer Neg Hx   ? Stomach cancer Neg Hx   ? ?Social History  ? ?Socioeconomic History  ? Marital status: Married  ?  Spouse name: Not on file  ? Number of children: 2  ? Years of education: Law  ? Highest education level: Not on file  ?Occupational History  ? Occupation: attorney  ?Tobacco Use  ? Smoking status: Never  ? Smokeless tobacco: Never  ?Vaping Use  ? Vaping Use: Never used  ?Substance and Sexual Activity  ? Alcohol use: Yes  ?  Comment: every other day - 2 drinks  ? Drug use: No  ? Sexual activity: Not on file  ?Other  Topics Concern  ? Not on file  ?Social History Narrative  ? Patient lives at home alone. Dating.  ? Caffeine Use: 1 cup every other day  ? Christian  ? No regular exercise, diet is so so  ? 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  ? ?Social Determinants of Health  ? ?Financial Resource Strain: Low Risk   ? Difficulty of Paying Living Expenses: Not hard at all  ?Food Insecurity: No Food Insecurity  ? Worried About Charity fundraiser in the Last Year: Never true  ? Ran Out of Food in the Last Year: Never true  ?Transportation Needs: No Transportation Needs  ? Lack of Transportation (Medical): No  ? Lack of Transportation (Non-Medical): No  ?Physical Activity: Sufficiently Active  ? Days of Exercise per Week: 3 days  ? Minutes of Exercise per Session: 60 min  ?Stress: No Stress Concern Present  ? Feeling of Stress : Only a little  ?Social Connections: Not on file  ? ? ?Tobacco Counseling ?Counseling given: Not Answered ? ? ?Clinical  Intake: ? ?Pre-visit preparation completed: Yes ? ?Pain : No/denies pain ? ?  ? ?Nutritional Status: BMI 25 -29 Overweight ?Nutritional Risks: None ?Diabetes: No ? ?How often do you need to have someone help you when you read instructions, pamphlets, or other written materials from your doctor or pharmacy?: 1 - Never ?What is the last grade level you completed in school?: doctorate ? ?Diabetic? no ? ?Interpreter Needed?: No ? ?Information entered by :: NAllen LPN ? ? ?Activities of Daily Living ? ?  10/24/2021  ?  8:41 AM 10/24/2021  ?  7:54 AM  ?In your present state of health, do you have any difficulty performing the following activities:  ?Hearing? 0 0  ?Vision? 0 0  ?Difficulty concentrating or making decisions? 0 0  ?Walking or climbing stairs? 0 0  ?Dressing or bathing? 0 0  ?Doing errands, shopping? 0 0  ?Preparing Food and eating ? N N  ?Using the Toilet? N N  ?In the past six months, have you accidently leaked urine? Y Y  ?Do you have problems with loss of bowel control? N N  ?Managing your Medications? N N  ?Managing your Finances? N N  ?Housekeeping or managing your Housekeeping? N N  ? ? ?Patient Care Team: ?Caren Macadam, MD as PCP - General (Family Medicine) ?Valinda Party, MD (Rheumatology) ? ?Indicate any recent Medical Services you may have received from other than [facility] providers in the past year (date may be approximate). ? ?   ?Assessment:  ? This is a routine wellness examination for Adam Oliver. ? ?Hearing/Vision screen ?Vision Screening - Comments:: Regular eye exams, Dr. Teodoro Spray ? ?Dietary issues and exercise activities discussed: ?Current Exercise Habits: Home exercise routine, Type of exercise: walking, Time (Minutes): 60, Frequency (Times/Week): 3, Weekly Exercise (Minutes/Week): 180 ? ? Goals Addressed   ? ?  ?  ?  ?  ? This Visit's Progress  ?  Patient Stated     ?  10/24/2021, no goals ?  ? ?  ? ?Depression Screen ? ?  10/24/2021  ?  8:41 AM 10/20/2020  ? 10:10 AM 10/20/2020  ? 10:04 AM  08/15/2020  ?  8:38 AM 06/29/2019  ?  8:18 AM 05/13/2018  ?  9:16 AM  ?PHQ 2/9 Scores  ?PHQ - 2 Score 0 0 0 0 0 0  ?PHQ- 9 Score      0  ?  ?Fall Risk ? ?  10/24/2021  ?  8:40 AM 10/24/2021  ?  7:54 AM 10/20/2020  ? 10:10 AM 08/15/2020  ?  8:38 AM 06/29/2019  ?  8:18 AM  ?Fall Risk   ?Falls in the past year? 0 0 0 0 0  ?Number falls in past yr: 0 0 0 0 0  ?Injury with Fall? 0 0 0 0 0  ?Risk for fall due to : Medication side effect      ?Follow up Falls evaluation completed;Education provided;Falls prevention discussed  Falls evaluation completed    ? ? ?FALL RISK PREVENTION PERTAINING TO THE HOME: ? ?Any stairs in or around the home? Yes  ?If so, are there any without handrails? No  ?Home free of loose throw rugs in walkways, pet beds, electrical cords, etc? Yes  ?Adequate lighting in your home to reduce risk of falls? Yes  ? ?ASSISTIVE DEVICES UTILIZED TO PREVENT FALLS: ? ?Life alert? No  ?Use of a cane, walker or w/c? No  ?Grab bars in the bathroom? No  ?Shower chair or bench in shower? Yes  ?Elevated toilet seat or a handicapped toilet? No  ? ?TIMED UP AND GO: ? ?Was the test performed? No .  ? ? ?Gait steady and fast without use of assistive device ? ?Cognitive Function: ?  ?  ?  ? ?Immunizations ?Immunization History  ?Administered Date(s) Administered  ? Fluad Quad(high Dose 65+) 06/29/2019  ? Influenza Split 03/30/2011  ? Influenza Whole 05/20/2007, 03/20/2010  ? Influenza,inj,Quad PF,6+ Mos 05/06/2013, 07/20/2014, 05/16/2015, 05/13/2018  ? Influenza-Unspecified 03/03/2021  ? PFIZER(Purple Top)SARS-COV-2 Vaccination 07/08/2019, 07/28/2019, 04/03/2020, 12/02/2020  ? Pension scheme manager 25yr & up 03/03/2021  ? Pneumococcal Conjugate-13 12/22/2018  ? Pneumococcal Polysaccharide-23 01/07/2020  ? Td 06/18/1998, 08/31/2009  ? Zoster, Live 05/31/2015  ? ? ?TDAP status: Due, Education has been provided regarding the importance of this vaccine. Advised may receive this vaccine at local pharmacy or Health  Dept. Aware to provide a copy of the vaccination record if obtained from local pharmacy or Health Dept. Verbalized acceptance and understanding. ? ?Flu Vaccine status: Up to date ? ?Pneumococcal vaccine

## 2022-02-02 ENCOUNTER — Other Ambulatory Visit: Payer: Self-pay | Admitting: *Deleted

## 2022-02-02 DIAGNOSIS — I10 Essential (primary) hypertension: Secondary | ICD-10-CM

## 2022-02-02 MED ORDER — AMLODIPINE BESYLATE 5 MG PO TABS: 5.0000 mg | ORAL_TABLET | Freq: Every day | ORAL | 0 refills | Status: DC

## 2022-02-02 NOTE — Telephone Encounter (Signed)
Pt needs appointment

## 2022-02-12 ENCOUNTER — Other Ambulatory Visit: Payer: Self-pay | Admitting: *Deleted

## 2022-02-12 DIAGNOSIS — I10 Essential (primary) hypertension: Secondary | ICD-10-CM

## 2022-02-12 MED ORDER — TELMISARTAN 40 MG PO TABS: 40.0000 mg | ORAL_TABLET | Freq: Every day | ORAL | 0 refills | Status: DC

## 2022-02-12 NOTE — Telephone Encounter (Signed)
Needs appt with me by October

## 2022-03-07 ENCOUNTER — Other Ambulatory Visit: Payer: Self-pay | Admitting: *Deleted

## 2022-03-07 MED ORDER — LEVOTHYROXINE SODIUM 50 MCG PO TABS
ORAL_TABLET | ORAL | 1 refills | Status: DC
Start: 2022-03-07 — End: 2022-08-31

## 2022-03-07 MED ORDER — ALLOPURINOL 100 MG PO TABS: 100.0000 mg | ORAL_TABLET | Freq: Two times a day (BID) | ORAL | 1 refills | Status: DC

## 2022-03-30 ENCOUNTER — Other Ambulatory Visit: Payer: Self-pay | Admitting: Family Medicine

## 2022-03-30 DIAGNOSIS — I10 Essential (primary) hypertension: Secondary | ICD-10-CM

## 2022-03-30 NOTE — Telephone Encounter (Signed)
Ok to refill but he needs an appt-- hasn't been seen since 04/13/21

## 2022-04-09 ENCOUNTER — Other Ambulatory Visit: Payer: Self-pay | Admitting: *Deleted

## 2022-04-09 DIAGNOSIS — E785 Hyperlipidemia, unspecified: Secondary | ICD-10-CM

## 2022-04-11 ENCOUNTER — Other Ambulatory Visit: Payer: Self-pay | Admitting: *Deleted

## 2022-04-11 DIAGNOSIS — E785 Hyperlipidemia, unspecified: Secondary | ICD-10-CM

## 2022-04-30 ENCOUNTER — Other Ambulatory Visit: Payer: Self-pay | Admitting: *Deleted

## 2022-04-30 DIAGNOSIS — E785 Hyperlipidemia, unspecified: Secondary | ICD-10-CM

## 2022-05-07 ENCOUNTER — Other Ambulatory Visit: Payer: Self-pay | Admitting: Family Medicine

## 2022-05-07 DIAGNOSIS — I10 Essential (primary) hypertension: Secondary | ICD-10-CM

## 2022-05-17 ENCOUNTER — Other Ambulatory Visit: Payer: Self-pay | Admitting: Family Medicine

## 2022-05-17 DIAGNOSIS — I10 Essential (primary) hypertension: Secondary | ICD-10-CM

## 2022-05-24 ENCOUNTER — Encounter: Payer: Self-pay | Admitting: Family Medicine

## 2022-05-24 ENCOUNTER — Ambulatory Visit (INDEPENDENT_AMBULATORY_CARE_PROVIDER_SITE_OTHER): Payer: Medicare Other | Admitting: Family Medicine

## 2022-05-24 VITALS — BP 124/88 | HR 75 | Temp 97.8°F | Ht 69.5 in | Wt 168.3 lb

## 2022-05-24 DIAGNOSIS — M109 Gout, unspecified: Secondary | ICD-10-CM

## 2022-05-24 DIAGNOSIS — E785 Hyperlipidemia, unspecified: Secondary | ICD-10-CM | POA: Diagnosis not present

## 2022-05-24 DIAGNOSIS — R3911 Hesitancy of micturition: Secondary | ICD-10-CM

## 2022-05-24 DIAGNOSIS — Z23 Encounter for immunization: Secondary | ICD-10-CM

## 2022-05-24 DIAGNOSIS — I10 Essential (primary) hypertension: Secondary | ICD-10-CM

## 2022-05-24 DIAGNOSIS — E038 Other specified hypothyroidism: Secondary | ICD-10-CM | POA: Diagnosis not present

## 2022-05-24 DIAGNOSIS — E782 Mixed hyperlipidemia: Secondary | ICD-10-CM

## 2022-05-24 DIAGNOSIS — Z125 Encounter for screening for malignant neoplasm of prostate: Secondary | ICD-10-CM

## 2022-05-24 MED ORDER — ALLOPURINOL 100 MG PO TABS: 100.0000 mg | ORAL_TABLET | Freq: Every day | ORAL | Status: DC

## 2022-05-24 MED ORDER — ATORVASTATIN CALCIUM 20 MG PO TABS: ORAL_TABLET | ORAL | 1 refills | Status: DC

## 2022-05-24 MED ORDER — AMLODIPINE BESYLATE 5 MG PO TABS: 5.0000 mg | ORAL_TABLET | Freq: Every day | ORAL | 1 refills | Status: DC

## 2022-05-24 NOTE — Progress Notes (Signed)
Established Patient Office Visit  Subjective   Patient ID: Adam Oliver, male    DOB: Oct 28, 1955  Age: 66 y.o. MRN: [id number]  Chief Complaint  Patient presents with   Establish Care    Pt is here for transition of care.   HTN-- pt states he has been out of his blood pressure medication for a while, states he ran out of his telmisartan for about a month and his amlodipine for about a week. States that he has been checking his blood pressure at home and his blood pressure is still in the normal range.   Patient has a history of MS-- he reports residual symptoms of numbness in his feet and hands, some balance issues, some hesitancy with urination. Has been present for at least 25 years, no changes in the last 25 years. States that his neurologist retired about 10 years ago, states he hasn't been to see a neurologist since.    Current Outpatient Medications  Medication Instructions   allopurinol (ZYLOPRIM) 100 mg, Oral, Daily   amLODipine (NORVASC) 5 mg, Oral, Daily   atorvastatin (LIPITOR) 20 MG tablet Takes 1 tab every other day at 6 pm   levothyroxine (SYNTHROID) 50 MCG tablet Last refill-patient needs appt with new PCP   telmisartan (MICARDIS) 40 mg, Oral, Daily    Patient Active Problem List   Diagnosis Date Noted   Nephrolithiasis 07/16/2017   Urinary incontinence 07/16/2017   Hyperglycemia 07/16/2017   Hypothyroidism 09/16/2007   Essential hypertension 07/17/2007   Hyperlipidemia 04/10/2007   Gout 04/10/2007   MULTIPLE SCLEROSIS 04/02/2007      Review of Systems  All other systems reviewed and are negative.     Objective:     BP 124/88 (BP Location: Left Arm, Patient Position: Sitting, Cuff Size: Normal)   Pulse 75   Temp 97.8 F (36.6 C) (Oral)   Ht 5' 9.5" (1.765 m)   Wt 168 lb 4.8 oz (76.3 kg)   SpO2 98%   BMI 24.50 kg/m     Physical Exam Vitals reviewed.  Constitutional:      Appearance: Normal appearance. He is well-groomed and normal weight.   Eyes:     Extraocular Movements: Extraocular movements intact.     Conjunctiva/sclera: Conjunctivae normal.  Neck:     Thyroid: No thyromegaly.  Cardiovascular:     Rate and Rhythm: Normal rate and regular rhythm.     Heart sounds: S1 normal and S2 normal. No murmur heard. Pulmonary:     Effort: Pulmonary effort is normal.     Breath sounds: Normal breath sounds and air entry. No rales.  Abdominal:     General: Abdomen is flat. Bowel sounds are normal.  Musculoskeletal:     Right lower leg: No edema.     Left lower leg: No edema.  Neurological:     General: No focal deficit present.     Mental Status: He is alert and oriented to person, place, and time.     Gait: Gait is intact.  Psychiatric:        Mood and Affect: Mood and affect normal.      No results found for any visits on 05/24/22.     The 10-year ASCVD risk score (Arnett DK, et al., 2019) is: 17.5%    Assessment & Plan:   Problem List Items Addressed This Visit       Unprioritized   Hypothyroidism    On 50 mcg daily of levothyroxine, will continue this  medication and he will need all new bloodwork today, TSH ordered.      Relevant Orders   TSH   Hyperlipidemia    On atorvastatin 20 mg daily, he needs a new lipid panel today. He denies any side effects to the medication, we reviewed his 10 year CVD risk score today.       Relevant Medications   atorvastatin (LIPITOR) 20 MG tablet   amLODipine (NORVASC) 5 MG tablet   Other Relevant Orders   Lipid Panel   Gout - Primary    On allopurinol 100 mg daily, he is not had any flare ups of his gout this year per his report, no joint pain, no side effects to the medication noted. Will continue the current dose.       Relevant Medications   allopurinol (ZYLOPRIM) 100 MG tablet   Essential hypertension    Has been out of his BP medication for some time, last dose of amlodipine was 1 week ago. Pt hase been checking daily and reports normal BP on the amlodipine  only. BP in office today is also WNL. I recommended we continue the amlodipine 5 mg daily only and new rx was written for this medication. He may stay off the telmisartan for now. If his BP is >140 at home then he was instructed to call the office and we will restart the telmisartan.      Relevant Medications   atorvastatin (LIPITOR) 20 MG tablet   amLODipine (NORVASC) 5 MG tablet   Other Relevant Orders   CMP   Other Visit Diagnoses     Need for influenza vaccination       Relevant Orders   Flu Vaccine QUAD High Dose(Fluad) (Completed)   Prostate cancer screening       Relevant Orders   PSA   Hesitancy of micturition       Relevant Orders   Pt reports this as a chronic issue, most likely related to his residual MS symptoms, however he has not had a PSA recently. Will place order for this.  PSA       Return in about 1 year (around 05/25/2023) for Annnual follow up visit.    Farrel Conners, MD

## 2022-05-25 ENCOUNTER — Other Ambulatory Visit (INDEPENDENT_AMBULATORY_CARE_PROVIDER_SITE_OTHER): Payer: Medicare Other

## 2022-05-25 DIAGNOSIS — R3911 Hesitancy of micturition: Secondary | ICD-10-CM

## 2022-05-25 DIAGNOSIS — E782 Mixed hyperlipidemia: Secondary | ICD-10-CM | POA: Diagnosis not present

## 2022-05-25 DIAGNOSIS — E038 Other specified hypothyroidism: Secondary | ICD-10-CM | POA: Diagnosis not present

## 2022-05-25 DIAGNOSIS — I10 Essential (primary) hypertension: Secondary | ICD-10-CM

## 2022-05-25 DIAGNOSIS — Z125 Encounter for screening for malignant neoplasm of prostate: Secondary | ICD-10-CM

## 2022-05-25 LAB — LIPID PANEL
Cholesterol: 177 mg/dL (ref 0–200)
HDL: 36.2 mg/dL — ABNORMAL LOW (ref >39.00)
NonHDL: 140.9
Total CHOL/HDL Ratio: 5
Triglycerides: 247 mg/dL — ABNORMAL HIGH (ref 0.0–149.0)
VLDL: 49.4 mg/dL — ABNORMAL HIGH (ref 0.0–40.0)

## 2022-05-25 LAB — COMPREHENSIVE METABOLIC PANEL
ALT: 24 U/L (ref 0–53)
AST: 19 U/L (ref 0–37)
Albumin: 4.3 g/dL (ref 3.5–5.2)
Alkaline Phosphatase: 85 U/L (ref 39–117)
BUN: 25 mg/dL — ABNORMAL HIGH (ref 6–23)
CO2: 29 mEq/L (ref 19–32)
Calcium: 9.1 mg/dL (ref 8.4–10.5)
Chloride: 106 mEq/L (ref 96–112)
Creatinine, Ser: 1.41 mg/dL (ref 0.40–1.50)
GFR: 51.2 mL/min — ABNORMAL LOW (ref >60.00)
Glucose, Bld: 122 mg/dL — ABNORMAL HIGH (ref 70–99)
Potassium: 4 mEq/L (ref 3.5–5.1)
Sodium: 142 mEq/L (ref 135–145)
Total Bilirubin: 1 mg/dL (ref 0.2–1.2)
Total Protein: 7 g/dL (ref 6.0–8.3)

## 2022-05-25 LAB — TSH: TSH: 3.09 u[IU]/mL (ref 0.35–5.50)

## 2022-05-25 LAB — PSA: PSA: 2.6 ng/mL (ref 0.10–4.00)

## 2022-05-25 LAB — LDL CHOLESTEROL, DIRECT: Direct LDL: 93 mg/dL

## 2022-05-25 NOTE — Assessment & Plan Note (Signed)
On 50 mcg daily of levothyroxine, will continue this medication and he will need all new bloodwork today, TSH ordered.

## 2022-05-25 NOTE — Assessment & Plan Note (Signed)
On allopurinol 100 mg daily, he is not had any flare ups of his gout this year per his report, no joint pain, no side effects to the medication noted. Will continue the current dose.

## 2022-05-25 NOTE — Assessment & Plan Note (Signed)
Has been out of his BP medication for some time, last dose of amlodipine was 1 week ago. Pt hase been checking daily and reports normal BP on the amlodipine only. BP in office today is also WNL. I recommended we continue the amlodipine 5 mg daily only and new rx was written for this medication. He may stay off the telmisartan for now. If his BP is >140 at home then he was instructed to call the office and we will restart the telmisartan.

## 2022-05-25 NOTE — Assessment & Plan Note (Signed)
On atorvastatin 20 mg daily, he needs a new lipid panel today. He denies any side effects to the medication, we reviewed his 10 year CVD risk score today.

## 2022-07-16 ENCOUNTER — Encounter (INDEPENDENT_AMBULATORY_CARE_PROVIDER_SITE_OTHER): Payer: Medicare Other | Admitting: Ophthalmology

## 2022-07-16 DIAGNOSIS — I10 Essential (primary) hypertension: Secondary | ICD-10-CM

## 2022-07-16 DIAGNOSIS — H353132 Nonexudative age-related macular degeneration, bilateral, intermediate dry stage: Secondary | ICD-10-CM

## 2022-07-16 DIAGNOSIS — H35033 Hypertensive retinopathy, bilateral: Secondary | ICD-10-CM | POA: Diagnosis not present

## 2022-07-16 DIAGNOSIS — H43813 Vitreous degeneration, bilateral: Secondary | ICD-10-CM

## 2022-08-31 ENCOUNTER — Other Ambulatory Visit: Payer: Self-pay | Admitting: Family Medicine

## 2022-08-31 DIAGNOSIS — M109 Gout, unspecified: Secondary | ICD-10-CM

## 2022-10-22 ENCOUNTER — Telehealth: Payer: Self-pay | Admitting: Family Medicine

## 2022-10-22 NOTE — Telephone Encounter (Signed)
Contacted Eldridge Dace to schedule their annual wellness visit. Appointment made for 10/31/22.  [name] AWV direct phone # [phone]   Due to schedule change 5/14 awv appt has been r/s to 5/15 Sent my chart message and l/m with appt date/time change

## 2022-10-30 ENCOUNTER — Telehealth: Payer: Self-pay | Admitting: Family Medicine

## 2022-10-30 NOTE — Telephone Encounter (Signed)
Contacted Adam Oliver to schedule their annual wellness visit. Appointment made for 11/07/22.  [name] AWV direct phone # [phone]  Pt called to r/s his 5/15 awv ti 5/22

## 2022-11-07 ENCOUNTER — Ambulatory Visit (INDEPENDENT_AMBULATORY_CARE_PROVIDER_SITE_OTHER): Payer: Medicare Other

## 2022-11-07 VITALS — BP 120/62 | HR 76 | Temp 98.2°F | Ht 69.5 in | Wt 166.4 lb

## 2022-11-07 DIAGNOSIS — Z0000 Encounter for general adult medical examination without abnormal findings: Secondary | ICD-10-CM | POA: Diagnosis not present

## 2022-11-07 NOTE — Progress Notes (Signed)
Subjective:   Adam Oliver is a 67 y.o. male who presents for Medicare Annual/Subsequent preventive examination.  Review of Systems     Cardiac Risk Factors include: advanced age (>15men, >60 women);male gender;hypertension     Objective:    Today's Vitals   11/07/22 1026  BP: 120/62  Pulse: 76  Temp: 98.2 F (36.8 C)  TempSrc: Oral  SpO2: 96%  Weight: 166 lb 6.4 oz (75.5 kg)  Height: 5' 9.5" (1.765 m)   Body mass index is 24.22 kg/m.     11/07/2022   10:40 AM 10/24/2021    8:40 AM 10/20/2020   10:07 AM 07/05/2018   12:08 AM 06/18/2017    6:58 PM  Advanced Directives  Does Patient Have a Medical Advance Directive? Yes Yes No No No  Type of Estate agent of [place];Living will Healthcare Power of [place];Living will     Copy of Healthcare Power of Attorney in Chart? No - copy requested No - copy requested     Would patient like information on creating a medical advance directive?   No - Patient declined No - Patient declined     Current Medications (verified) Outpatient Encounter Medications as of 11/07/2022  Medication Sig   allopurinol (ZYLOPRIM) 100 MG tablet TAKE 1 TABLET BY MOUTH TWICE A DAY   amLODipine (NORVASC) 5 MG tablet Take 1 tablet (5 mg total) by mouth daily.   atorvastatin (LIPITOR) 20 MG tablet Takes 1 tab every other day at 6 pm   levothyroxine (SYNTHROID) 50 MCG tablet Take 1 tablet (50 mcg total) by mouth daily   [DISCONTINUED] telmisartan (MICARDIS) 40 MG tablet Take 1 tablet (40 mg total) by mouth daily.   No facility-administered encounter medications on file as of 11/07/2022.    Allergies (verified) Patient has no known allergies.   History: Past Medical History:  Diagnosis Date   Allergy    slight    Arthritis    thumbs   Gout    Hyperlipidemia    Hypertension    Hypothyroidism    Multiple sclerosis (HCC)    Neuromuscular disorder (HCC)    MS    Past Surgical History:  Procedure Laterality Date    COLONOSCOPY     OTHER SURGICAL HISTORY     right thumb tendon surgery   Family History  Problem Relation Age of Onset   Pancreatic cancer Mother    Throat cancer Father    Prostate cancer Brother 34   Esophageal cancer Brother    Cancer Paternal Grandmother 52       unknown type   Heart disease Paternal Grandfather    Skin cancer Paternal Grandfather    Colon cancer Neg Hx    Colon polyps Neg Hx    Rectal cancer Neg Hx    Stomach cancer Neg Hx    Social History   Socioeconomic History   Marital status: Married    Spouse name: Not on file   Number of children: 2   Years of education: Law   Highest education level: Not on file  Occupational History   Occupation: attorney  Tobacco Use   Smoking status: Never   Smokeless tobacco: Never  Vaping Use   Vaping Use: Never used  Substance and Sexual Activity   Alcohol use: Yes    Comment: every other day - 2 drinks   Drug use: No   Sexual activity: Not on file  Other Topics Concern   Not on file  Social History Narrative   Patient lives at home alone. Dating.   Caffeine Use: 1 cup every other day   Christian   No regular exercise, diet is so so   [employer].   Social Determinants of Health   Financial Resource Strain: Low Risk  (11/07/2022)   Overall Financial Resource Strain (CARDIA)    Difficulty of Paying Living Expenses: Not hard at all  Food Insecurity: No Food Insecurity (11/07/2022)   Hunger Vital Sign    Worried About Running Out of Food in the Last Year: Never true    Ran Out of Food in the Last Year: Never true  Transportation Needs: No Transportation Needs (11/07/2022)   PRAPARE - Administrator, Civil Service (Medical): No    Lack of Transportation (Non-Medical): No  Physical Activity: Insufficiently Active (11/07/2022)   Exercise Vital Sign    Days of Exercise per Week: 2 days    Minutes of Exercise per Session: 30 min  Stress: No Stress Concern Present (11/07/2022)   [employer] of Occupational Health - Occupational Stress Questionnaire    Feeling of Stress : Not at all  Social Connections: Socially Integrated (11/07/2022)   Social Connection and Isolation Panel [NHANES]    Frequency of Communication with Friends and Family: More than three times a week    Frequency of Social Gatherings with Friends and Family: More than three times a week    Attends Religious Services: More than 4 times per year    Active Member of [employer] or Organizations: Yes    Attends Engineer, structural: More than 4 times per year    Marital Status: Married    Tobacco Counseling Counseling given: Not Answered   Clinical Intake:  Pre-visit preparation completed: Yes  Pain : No/denies pain     BMI - recorded: 24.22 Nutritional Status: BMI of 19-24  Normal Nutritional Risks: None Diabetes: No  How often do you need to have someone help you when you read instructions, pamphlets, or other written materials from your doctor or pharmacy?: 1 - Never  Diabetic?  No  Interpreter Needed?: No  Information entered by :: Theresa Mulligan LPN   Activities of Daily Living    11/07/2022   10:39 AM 11/07/2022   10:38 AM  In your present state of health, do you have any difficulty performing the following activities:  Hearing? 0 0  Vision? 0 0  Difficulty concentrating or making decisions? 0 0  Walking or climbing stairs? 0 0  Dressing or bathing? 0 0  Doing errands, shopping? 0 0  Preparing Food and eating ?  N  Using the Toilet?  N  In the past six months, have you accidently leaked urine?  Y  Comment  Followed by PCP  Do you have problems with loss of bowel control?  N  Managing your Medications?  N  Managing your Finances?  N  Housekeeping or managing your Housekeeping?  N    Patient Care Team: Karie Georges, MD as PCP - General (Family Medicine) Rossie Muskrat, MD (Rheumatology)  Indicate any recent Medical Services you may have received from other  than [facility] providers in the past year (date may be approximate).     Assessment:   This is a routine wellness examination for Adam Oliver.  Hearing/Vision screen Hearing Screening - Comments:: Denies hearing difficulties   Vision Screening - Comments:: Wears rx glasses - up to date with routine eye exams with  Dr Hyacinth Meeker  Dietary issues and exercise activities discussed: Current Exercise Habits: Home exercise routine, Type of exercise: walking, Time (Minutes): 30, Frequency (Times/Week): 2, Weekly Exercise (Minutes/Week): 60, Intensity: Moderate, Exercise limited by: None identified   Goals Addressed               This Visit's Progress     NocCurrent goals (pt-stated)         Depression Screen    11/07/2022   10:37 AM 10/24/2021    8:41 AM 10/20/2020   10:10 AM 10/20/2020   10:04 AM 08/15/2020    8:38 AM 06/29/2019    8:18 AM 05/13/2018    9:16 AM  PHQ 2/9 Scores  PHQ - 2 Score 0 0 0 0 0 0 0  PHQ- 9 Score       0    Fall Risk    11/07/2022   10:40 AM 10/30/2022    9:46 AM 10/24/2021    8:40 AM 10/24/2021    7:54 AM 10/20/2020   10:10 AM  Fall Risk   Falls in the past year? 0 0 0 0 0  Number falls in past yr: 0 0 0 0 0  Injury with Fall? 0 0 0 0 0  Risk for fall due to : No Fall Risks  Medication side effect    Follow up Falls prevention discussed  Falls evaluation completed;Education provided;Falls prevention discussed  Falls evaluation completed    FALL RISK PREVENTION PERTAINING TO THE HOME:  Any stairs in or around the home? Yes  If so, are there any without handrails? No  Home free of loose throw rugs in walkways, pet beds, electrical cords, etc? Yes  Adequate lighting in your home to reduce risk of falls? Yes   ASSISTIVE DEVICES UTILIZED TO PREVENT FALLS:  Life alert? No  Use of a cane, walker or w/c? No  Grab bars in the bathroom? No  Shower chair or bench in shower? Yes  Elevated toilet seat or a handicapped toilet? No   TIMED UP AND GO:  Was the test  performed? Yes .  Length of time to ambulate 10 feet: 10 sec.   Gait steady and fast without use of assistive device  Cognitive Function:        11/07/2022   10:41 AM  6CIT Screen  What Year? 0 points  What month? 0 points  What time? 0 points  Count back from 20 0 points  Months in reverse 0 points  Repeat phrase 0 points  Total Score 0 points    Immunizations Immunization History  Administered Date(s) Administered   Fluad Quad(high Dose 65+) 06/29/2019, 05/24/2022   Influenza Split 03/30/2011   Influenza Whole 05/20/2007, 03/20/2010   Influenza,inj,Quad PF,6+ Mos 05/06/2013, 07/20/2014, 05/16/2015, 05/13/2018   Influenza-Unspecified 03/03/2021   PFIZER(Purple Top)SARS-COV-2 Vaccination 07/08/2019, 07/28/2019, 04/03/2020, 12/02/2020   Pfizer Covid-19 Vaccine Bivalent Booster 67yrs & up 03/03/2021, 04/24/2022   Pneumococcal Conjugate-13 12/22/2018   Pneumococcal Polysaccharide-23 01/07/2020   RSV,unspecified 04/24/2022   Td 06/18/1998, 08/31/2009   Zoster, Live 05/31/2015    TDAP status: Due, Education has been provided regarding the importance of this vaccine. Advised may receive this vaccine at local pharmacy or Health Dept. Aware to provide a copy of the vaccination record if obtained from local pharmacy or Health Dept. Verbalized acceptance and understanding.  Flu Vaccine status: Up to date  Pneumococcal vaccine status: Up to date  Covid-19 vaccine status: Completed vaccines  Qualifies for Shingles Vaccine? Yes   Zostavax completed  No   Shingrix Completed?: No.    Education has been provided regarding the importance of this vaccine. Patient has been advised to call insurance company to determine out of pocket expense if they have not yet received this vaccine. Advised may also receive vaccine at local pharmacy or Health Dept. Verbalized acceptance and understanding.  Screening Tests Health Maintenance  Topic Date Due   DTaP/Tdap/Td (3 - Tdap) 09/01/2019    COVID-19 Vaccine (7 - 2023-24 season) 11/23/2022 (Originally 06/19/2022)   Zoster Vaccines- Shingrix (1 of 2) 02/07/2023 (Originally 11/12/2003)   INFLUENZA VACCINE  01/17/2023   Medicare Annual Wellness (AWV)  11/07/2023   COLONOSCOPY (Pts 45-33yrs Insurance coverage will need to be confirmed)  11/09/2023   Pneumonia Vaccine 19+ Years old  Completed   Hepatitis C Screening  Addressed   HPV VACCINES  Aged Out    Health Maintenance  Health Maintenance Due  Topic Date Due   DTaP/Tdap/Td (3 - Tdap) 09/01/2019    Colorectal cancer screening: Type of screening: Colonoscopy. Completed 11/08/20. Repeat every 3 years  Lung Cancer Screening: (Low Dose CT Chest recommended if Age 47-80 years, 30 pack-year currently smoking OR have quit w/in 15years.) does not qualify.     Additional Screening:  Hepatitis C Screening: does qualify; Completed 05/31/15  Vision Screening: Recommended annual ophthalmology exams for early detection of glaucoma and other disorders of the eye. Is the patient up to date with their annual eye exam?  Yes  Who is the provider or what is the name of the office in which the patient attends annual eye exams? Dr Hyacinth Meeker If pt is not established with a provider, would they like to be referred to a provider to establish care? No .   Dental Screening: Recommended annual dental exams for proper oral hygiene  Community Resource Referral / Chronic Care Management:  CRR required this visit?  No   CCM required this visit?  No      Plan:     I have personally reviewed and noted the following in the patient's chart:   Medical and social history Use of alcohol, tobacco or illicit drugs  Current medications and supplements including opioid prescriptions. Patient is not currently taking opioid prescriptions. Functional ability and status Nutritional status Physical activity Advanced directives List of other physicians Hospitalizations, surgeries, and ER visits in previous  12 months Vitals Screenings to include cognitive, depression, and falls Referrals and appointments  In addition, I have reviewed and discussed with patient certain preventive protocols, quality metrics, and best practice recommendations. A written personalized care plan for preventive services as well as general preventive health recommendations were provided to patient.     Tillie Rung, LPN   1/61/0960   Nurse Notes: None

## 2022-11-07 NOTE — Patient Instructions (Addendum)
Adam Oliver , Thank you for taking time to come for your Medicare Wellness Visit. I appreciate your ongoing commitment to your health goals. Please review the following plan we discussed and let me know if I can assist you in the future.   These are the goals we discussed:  Goals       Exercise 3x per week (30 min per time)      NocCurrent goals (pt-stated)      Patient Stated      10/24/2021, no goals        This is a list of the screening recommended for you and due dates:  Health Maintenance  Topic Date Due   DTaP/Tdap/Td vaccine (3 - Tdap) 09/01/2019   COVID-19 Vaccine (7 - 2023-24 season) 11/23/2022*   Zoster (Shingles) Vaccine (1 of 2) 02/07/2023*   Flu Shot  01/17/2023   Medicare Annual Wellness Visit  11/07/2023   Colon Cancer Screening  11/09/2023   Pneumonia Vaccine  Completed   Hepatitis C Screening: USPSTF Recommendation to screen - Ages 18-79 yo.  Addressed   HPV Vaccine  Aged Out  *Topic was postponed. The date shown is not the original due date.    Advanced directives: Please bring a copy of your health care power of attorney and living will to the office to be added to your chart at your convenience.   Conditions/risks identified: None  Next appointment: Follow up in one year for your annual wellness visit    Preventive Care 65 Years and Older, Male Preventive care refers to lifestyle choices and visits with your health care provider that can promote health and wellness. What does preventive care include? A yearly physical exam. This is also called an annual well check. Dental exams once or twice a year. Routine eye exams. Ask your health care provider how often you should have your eyes checked. Personal lifestyle choices, including: Daily care of your teeth and gums. Regular physical activity. Eating a healthy diet. Avoiding tobacco and drug use. Limiting alcohol use. Practicing safe sex. Taking low-dose aspirin every day. Taking vitamin and mineral  supplements as recommended by your health care provider. What happens during an annual well check? The services and screenings done by your health care provider during your annual well check will depend on your age, overall health, lifestyle risk factors, and family history of disease. Counseling  Your health care provider may ask you questions about your: Alcohol use. Tobacco use. Drug use. Emotional well-being. Home and relationship well-being. Sexual activity. Eating habits. History of falls. Memory and ability to understand (cognition). Work and work Astronomer. Reproductive health. Screening  You may have the following tests or measurements: Height, weight, and BMI. Blood pressure. Lipid and cholesterol levels. These may be checked every 5 years, or more frequently if you are over 72 years old. Skin check. Lung cancer screening. You may have this screening every year starting at age 53 if you have a 30-pack-year history of smoking and currently smoke or have quit within the past 15 years. Fecal occult blood test (FOBT) of the stool. You may have this test every year starting at age 56. Flexible sigmoidoscopy or colonoscopy. You may have a sigmoidoscopy every 5 years or a colonoscopy every 10 years starting at age 30. Hepatitis C blood test. Hepatitis B blood test. Sexually transmitted disease (STD) testing. Diabetes screening. This is done by checking your blood sugar (glucose) after you have not eaten for a while (fasting). You may have this  done every 1-3 years. Bone density scan. This is done to screen for osteoporosis. You may have this done starting at age 41. Mammogram. This may be done every 1-2 years. Talk to your health care provider about how often you should have regular mammograms. Talk with your health care provider about your test results, treatment options, and if necessary, the need for more tests. Vaccines  Your health care provider may recommend certain  vaccines, such as: Influenza vaccine. This is recommended every year. Tetanus, diphtheria, and acellular pertussis (Tdap, Td) vaccine. You may need a Td booster every 10 years. Zoster vaccine. You may need this after age 39. Pneumococcal 13-valent conjugate (PCV13) vaccine. One dose is recommended after age 37. Pneumococcal polysaccharide (PPSV23) vaccine. One dose is recommended after age 53. Talk to your health care provider about which screenings and vaccines you need and how often you need them. This information is not intended to replace advice given to you by your health care provider. Make sure you discuss any questions you have with your health care provider. Document Released: 07/01/2015 Document Revised: 02/22/2016 Document Reviewed: 04/05/2015 Elsevier Interactive Patient Education  2017 [employer].  Fall Prevention in the Home Falls can cause injuries. They can happen to people of all ages. There are many things you can do to make your home safe and to help prevent falls. What can I do on the outside of my home? Regularly fix the edges of walkways and driveways and fix any cracks. Remove anything that might make you trip as you walk through a door, such as a raised step or threshold. Trim any bushes or trees on the path to your home. Use bright outdoor lighting. Clear any walking paths of anything that might make someone trip, such as rocks or tools. Regularly check to see if handrails are loose or broken. Make sure that both sides of any steps have handrails. Any raised decks and porches should have guardrails on the edges. Have any leaves, snow, or ice cleared regularly. Use sand or salt on walking paths during winter. Clean up any spills in your garage right away. This includes oil or grease spills. What can I do in the bathroom? Use night lights. Install grab bars by the toilet and in the tub and shower. Do not use towel bars as grab bars. Use non-skid mats or decals in  the tub or shower. If you need to sit down in the shower, use a plastic, non-slip stool. Keep the floor dry. Clean up any water that spills on the floor as soon as it happens. Remove soap buildup in the tub or shower regularly. Attach bath mats securely with double-sided non-slip rug tape. Do not have throw rugs and other things on the floor that can make you trip. What can I do in the bedroom? Use night lights. Make sure that you have a light by your bed that is easy to reach. Do not use any sheets or blankets that are too big for your bed. They should not hang down onto the floor. Have a firm chair that has side arms. You can use this for support while you get dressed. Do not have throw rugs and other things on the floor that can make you trip. What can I do in the kitchen? Clean up any spills right away. Avoid walking on wet floors. Keep items that you use a lot in easy-to-reach places. If you need to reach something above you, use a strong step stool that  has a grab bar. Keep electrical cords out of the way. Do not use floor polish or wax that makes floors slippery. If you must use wax, use non-skid floor wax. Do not have throw rugs and other things on the floor that can make you trip. What can I do with my stairs? Do not leave any items on the stairs. Make sure that there are handrails on both sides of the stairs and use them. Fix handrails that are broken or loose. Make sure that handrails are as long as the stairways. Check any carpeting to make sure that it is firmly attached to the stairs. Fix any carpet that is loose or worn. Avoid having throw rugs at the top or bottom of the stairs. If you do have throw rugs, attach them to the floor with carpet tape. Make sure that you have a light switch at the top of the stairs and the bottom of the stairs. If you do not have them, ask someone to add them for you. What else can I do to help prevent falls? Wear shoes that: Do not have high  heels. Have rubber bottoms. Are comfortable and fit you well. Are closed at the toe. Do not wear sandals. If you use a stepladder: Make sure that it is fully opened. Do not climb a closed stepladder. Make sure that both sides of the stepladder are locked into place. Ask someone to hold it for you, if possible. Clearly mark and make sure that you can see: Any grab bars or handrails. First and last steps. Where the edge of each step is. Use tools that help you move around (mobility aids) if they are needed. These include: Canes. Walkers. Scooters. Crutches. Turn on the lights when you go into a dark area. Replace any light bulbs as soon as they burn out. Set up your furniture so you have a clear path. Avoid moving your furniture around. If any of your floors are uneven, fix them. If there are any pets around you, be aware of where they are. Review your medicines with your doctor. Some medicines can make you feel dizzy. This can increase your chance of falling. Ask your doctor what other things that you can do to help prevent falls. This information is not intended to replace advice given to you by your health care provider. Make sure you discuss any questions you have with your health care provider. Document Released: 03/31/2009 Document Revised: 11/10/2015 Document Reviewed: 07/09/2014 Elsevier Interactive Patient Education  2017 [employer].

## 2022-11-27 ENCOUNTER — Other Ambulatory Visit: Payer: Self-pay | Admitting: Family Medicine

## 2022-11-27 DIAGNOSIS — I10 Essential (primary) hypertension: Secondary | ICD-10-CM

## 2023-02-25 ENCOUNTER — Other Ambulatory Visit: Payer: Self-pay | Admitting: Family Medicine

## 2023-03-01 ENCOUNTER — Ambulatory Visit: Payer: Medicare Other

## 2023-03-11 ENCOUNTER — Ambulatory Visit (INDEPENDENT_AMBULATORY_CARE_PROVIDER_SITE_OTHER): Payer: Medicare Other

## 2023-03-11 DIAGNOSIS — Z23 Encounter for immunization: Secondary | ICD-10-CM | POA: Diagnosis not present

## 2023-05-28 ENCOUNTER — Other Ambulatory Visit: Payer: Self-pay | Admitting: Family Medicine

## 2023-05-28 DIAGNOSIS — I10 Essential (primary) hypertension: Secondary | ICD-10-CM

## 2023-05-28 NOTE — Telephone Encounter (Signed)
>  1 year since last visit, I will fill but he needs an appointment in order to continue his prescriptions, please have pt schedule.

## 2023-05-28 NOTE — Telephone Encounter (Signed)
Left a detailed voicemail message on the patient's cell number with the information below.

## 2023-05-31 ENCOUNTER — Other Ambulatory Visit: Payer: Self-pay | Admitting: Family Medicine

## 2023-06-25 ENCOUNTER — Ambulatory Visit: Payer: Medicare Other | Admitting: Family Medicine

## 2023-06-25 ENCOUNTER — Encounter: Payer: Self-pay | Admitting: Family Medicine

## 2023-06-25 VITALS — BP 140/90 | HR 78 | Temp 97.8°F | Ht 69.5 in | Wt 167.3 lb

## 2023-06-25 DIAGNOSIS — E038 Other specified hypothyroidism: Secondary | ICD-10-CM

## 2023-06-25 DIAGNOSIS — I10 Essential (primary) hypertension: Secondary | ICD-10-CM | POA: Diagnosis not present

## 2023-06-25 DIAGNOSIS — M109 Gout, unspecified: Secondary | ICD-10-CM

## 2023-06-25 DIAGNOSIS — E785 Hyperlipidemia, unspecified: Secondary | ICD-10-CM | POA: Diagnosis not present

## 2023-06-25 DIAGNOSIS — Z125 Encounter for screening for malignant neoplasm of prostate: Secondary | ICD-10-CM | POA: Diagnosis not present

## 2023-06-25 LAB — LIPID PANEL
Cholesterol: 237 mg/dL — ABNORMAL HIGH (ref 0–200)
HDL: 45.6 mg/dL (ref >39.00)
LDL Cholesterol: 157 mg/dL — ABNORMAL HIGH (ref 0–99)
NonHDL: 191.24
Total CHOL/HDL Ratio: 5
Triglycerides: 173 mg/dL — ABNORMAL HIGH (ref 0.0–149.0)
VLDL: 34.6 mg/dL (ref 0.0–40.0)

## 2023-06-25 LAB — COMPREHENSIVE METABOLIC PANEL
ALT: 17 U/L (ref 0–53)
AST: 15 U/L (ref 0–37)
Albumin: 4.5 g/dL (ref 3.5–5.2)
Alkaline Phosphatase: 76 U/L (ref 39–117)
BUN: 19 mg/dL (ref 6–23)
CO2: 30 meq/L (ref 19–32)
Calcium: 9.4 mg/dL (ref 8.4–10.5)
Chloride: 103 meq/L (ref 96–112)
Creatinine, Ser: 1.26 mg/dL (ref 0.40–1.50)
GFR: 58.15 mL/min — ABNORMAL LOW (ref >60.00)
Glucose, Bld: 99 mg/dL (ref 70–99)
Potassium: 4.4 meq/L (ref 3.5–5.1)
Sodium: 141 meq/L (ref 135–145)
Total Bilirubin: 1.5 mg/dL — ABNORMAL HIGH (ref 0.2–1.2)
Total Protein: 7.4 g/dL (ref 6.0–8.3)

## 2023-06-25 LAB — PSA, MEDICARE: PSA: 3.1 ng/mL (ref 0.10–4.00)

## 2023-06-25 LAB — LDL CHOLESTEROL, DIRECT: Direct LDL: 148 mg/dL

## 2023-06-25 LAB — URIC ACID: Uric Acid, Serum: 8.9 mg/dL — ABNORMAL HIGH (ref 4.0–7.8)

## 2023-06-25 LAB — TSH: TSH: 2.06 u[IU]/mL (ref 0.35–5.50)

## 2023-06-25 MED ORDER — ATORVASTATIN CALCIUM 20 MG PO TABS: ORAL_TABLET | ORAL | 1 refills | Status: DC

## 2023-06-25 NOTE — Patient Instructions (Signed)
 [facility] phone number [phone] -- will be due for Colonoscopy in May for 3 year follow up.

## 2023-06-25 NOTE — Progress Notes (Signed)
 Established Patient Office Visit  Subjective   Patient ID: Adam Oliver, male    DOB: 10-12-1955  Age: 68 y.o. MRN: [id number]  Chief Complaint  Patient presents with   Medical Management of Chronic Issues    Pt is here for his yearly follow up and medication refills  HTN- pt is curently on 5 mg daily of amlodipine . States he is taking the BP medication and lipitor every other day, has been checking his blood pressure daily, usually getting 120-130's systolic. Today is elevated, I counseled him on taking the amlodipine  every day to enhance the effect of the medication and avoid any high pressures that may occur.   Gout-- pt reduced his allopurinol  to 1 tablet daily, he reports no gout flare ups since his last visit 1 year ago, no changes in joint pain or other issues reported.   HLD-- pt continues on statin every other day, he denies any side effects including muscle cramps, needs yearly surveillance with bloodwork today and refills. Reports compliance with medication.     Current Outpatient Medications  Medication Instructions   allopurinol  (ZYLOPRIM ) 100 mg, Oral, 2 times daily   amLODipine  (NORVASC ) 5 mg, Oral, Daily   atorvastatin  (LIPITOR) 20 MG tablet Takes 1 tab every other day at 6 pm   levothyroxine  (SYNTHROID ) 50 mcg, Oral, Daily    Patient Active Problem List   Diagnosis Date Noted   Nephrolithiasis 07/16/2017   Urinary incontinence 07/16/2017   Hyperglycemia 07/16/2017   Hypothyroidism 09/16/2007   Essential hypertension 07/17/2007   Hyperlipidemia 04/10/2007   Gout 04/10/2007   MULTIPLE SCLEROSIS 04/02/2007      Review of Systems  All other systems reviewed and are negative.     Objective:     BP (!) 140/90   Pulse 78   Temp 97.8 F (36.6 C) (Oral)   Ht 5' 9.5 (1.765 m)   Wt 167 lb 4.8 oz (75.9 kg)   SpO2 98%   BMI 24.35 kg/m    Physical Exam Vitals reviewed.  Constitutional:      Appearance: Normal appearance. He is well-groomed and  normal weight.  Eyes:     Extraocular Movements: Extraocular movements intact.     Conjunctiva/sclera: Conjunctivae normal.  Neck:     Thyroid : No thyromegaly.  Cardiovascular:     Rate and Rhythm: Normal rate and regular rhythm.     Heart sounds: S1 normal and S2 normal. No murmur heard. Pulmonary:     Effort: Pulmonary effort is normal.     Breath sounds: Normal breath sounds and air entry. No rales.  Abdominal:     General: Bowel sounds are normal.  Musculoskeletal:     Right lower leg: No edema.     Left lower leg: No edema.  Neurological:     General: No focal deficit present.     Mental Status: He is alert and oriented to person, place, and time.     Gait: Gait is intact.  Psychiatric:        Mood and Affect: Mood and affect normal.      The 10-year ASCVD risk score (Arnett DK, et al., 2019) is: 25.5%    Assessment & Plan:  Gout, unspecified cause, unspecified chronicity, unspecified site Assessment & Plan: On allopurinol  100 mg daily, he is not had any flare ups of his gout this year per his report, no joint pain, no side effects to the medication noted. Will continue the current dose. Checking uric acid level  today.  Orders: -     Uric acid -     Uric acid; Future  Hyperlipidemia, unspecified hyperlipidemia type Assessment & Plan: On atorvastatin  20 mg every other day, he needs a new lipid panel today. He denies any side effects to the medication, we reviewed his 10 year CVD risk score today.   Orders: -     Atorvastatin  Calcium ; Takes 1 tab every other day at 6 pm  Dispense: 90 tablet; Refill: 1 -     Lipid panel; Future -     LDL cholesterol, direct -     Lipid panel; Future  Other specified hypothyroidism Assessment & Plan: On 50 mcg daily of levothyroxine , will continue this medication and he will need all new bloodwork today, TSH ordered.  Orders: -     TSH; Future  Essential hypertension Assessment & Plan: BP is elevated today, he is taking the  amlodipine  5 mg every other day. I encouraged him to take it daily and to separate it from the atorvastatin  by taking it at nighttime.   Orders: -     Comprehensive metabolic panel  Prostate cancer screening -     PSA, Medicare     Return in about 1 year (around 06/24/2024) for HTN.    Heron CHRISTELLA Sharper, MD

## 2023-06-26 ENCOUNTER — Encounter: Payer: Self-pay | Admitting: Family Medicine

## 2023-06-27 NOTE — Assessment & Plan Note (Signed)
 On atorvastatin 20 mg every other day, he needs a new lipid panel today. He denies any side effects to the medication, we reviewed his 10 year CVD risk score today.

## 2023-06-27 NOTE — Assessment & Plan Note (Signed)
 BP is elevated today, he is taking the amlodipine 5 mg every other day. I encouraged him to take it daily and to separate it from the atorvastatin by taking it at nighttime.

## 2023-06-27 NOTE — Assessment & Plan Note (Signed)
On 50 mcg daily of levothyroxine, will continue this medication and he will need all new bloodwork today, TSH ordered.

## 2023-06-27 NOTE — Assessment & Plan Note (Signed)
 On allopurinol 100 mg daily, he is not had any flare ups of his gout this year per his report, no joint pain, no side effects to the medication noted. Will continue the current dose. Checking uric acid level today.

## 2023-07-15 ENCOUNTER — Encounter (INDEPENDENT_AMBULATORY_CARE_PROVIDER_SITE_OTHER): Payer: Medicare Other | Admitting: Ophthalmology

## 2023-07-15 DIAGNOSIS — I10 Essential (primary) hypertension: Secondary | ICD-10-CM | POA: Diagnosis not present

## 2023-07-15 DIAGNOSIS — H353132 Nonexudative age-related macular degeneration, bilateral, intermediate dry stage: Secondary | ICD-10-CM | POA: Diagnosis not present

## 2023-07-15 DIAGNOSIS — H43813 Vitreous degeneration, bilateral: Secondary | ICD-10-CM | POA: Diagnosis not present

## 2023-07-15 DIAGNOSIS — H35033 Hypertensive retinopathy, bilateral: Secondary | ICD-10-CM

## 2023-08-26 ENCOUNTER — Other Ambulatory Visit: Payer: Self-pay | Admitting: Family Medicine

## 2023-08-26 DIAGNOSIS — I10 Essential (primary) hypertension: Secondary | ICD-10-CM

## 2023-09-13 ENCOUNTER — Other Ambulatory Visit: Payer: Self-pay | Admitting: Family Medicine

## 2023-09-13 DIAGNOSIS — M109 Gout, unspecified: Secondary | ICD-10-CM

## 2023-09-13 DIAGNOSIS — I10 Essential (primary) hypertension: Secondary | ICD-10-CM

## 2023-11-27 ENCOUNTER — Ambulatory Visit (INDEPENDENT_AMBULATORY_CARE_PROVIDER_SITE_OTHER): Payer: Medicare Other

## 2023-11-27 VITALS — BP 122/64 | HR 67 | Temp 97.6°F | Ht 69.5 in | Wt 167.3 lb

## 2023-11-27 DIAGNOSIS — Z0000 Encounter for general adult medical examination without abnormal findings: Secondary | ICD-10-CM

## 2023-11-27 DIAGNOSIS — Z1211 Encounter for screening for malignant neoplasm of colon: Secondary | ICD-10-CM | POA: Diagnosis not present

## 2023-11-27 NOTE — Patient Instructions (Addendum)
 Adam Oliver , Thank you for taking time out of your busy schedule to complete your Annual Wellness Visit with me. I enjoyed our conversation and look forward to speaking with you again next year. I, as well as your care team,  appreciate your ongoing commitment to your health goals. Please review the following plan we discussed and let me know if I can assist you in the future. Your Game plan/ To Do List    Referrals: If you haven't heard from the office you've been referred to, please reach out to them at the phone provided.   Follow up Visits: Next Medicare AWV with our clinical staff: 12/02/24 @ 10a   Have you seen your provider in the last 6 months (3 months if uncontrolled diabetes)? 06/25/23 Next Office Visit with your provider:   Clinician Recommendations:  Aim for 30 minutes of exercise or brisk walking, 6-8 glasses of water, and 5 servings of fruits and vegetables each day.       This is a list of the screening recommended for you and due dates:  Health Maintenance  Topic Date Due   Zoster (Shingles) Vaccine (1 of 2) 11/12/2003   DTaP/Tdap/Td vaccine (3 - Tdap) 09/01/2019   COVID-19 Vaccine (7 - 2024-25 season) 02/17/2023   Colon Cancer Screening  11/09/2023   Flu Shot  01/17/2024   Medicare Annual Wellness Visit  11/26/2024   Pneumonia Vaccine  Completed   Hepatitis C Screening  Addressed   HPV Vaccine  Aged Out   Meningitis B Vaccine  Aged Out    Advanced directives: (Copy Requested) Please bring a copy of your health care power of attorney and living will to the office to be added to your chart at your convenience. You can mail to [facility] [address] [place], [place] [address] or email to [email or web address] Advance Care Planning is important because it:  [x]  Makes sure you receive the medical care that is consistent with your values, goals, and preferences  [x]  It provides guidance to your family and loved ones and reduces their decisional  burden about whether or not they are making the right decisions based on your wishes.  Follow the link provided in your after visit summary or read over the paperwork we have mailed to you to help you started getting your Advance Directives in place. If you need assistance in completing these, please reach out to us  so that we can help you!  See attachments for Preventive Care and Fall Prevention Tips.

## 2023-11-27 NOTE — Progress Notes (Signed)
 Subjective:   Adam Oliver is a 68 y.o. who presents for a Medicare Wellness preventive visit.  As a reminder, Annual Wellness Visits don't include a physical exam, and some assessments may be limited, especially if this visit is performed virtually. We may recommend an in-person follow-up visit with your provider if needed.  Visit Complete: In person    Persons Participating in Visit: Patient.  AWV Questionnaire: No: Patient Medicare AWV questionnaire was not completed prior to this visit.  Cardiac Risk Factors include: advanced age (>9men, >33 women);hypertension;male gender     Objective:     Today's Vitals   11/27/23 0959  BP: 122/64  Pulse: 67  Temp: 97.6 F (36.4 C)  TempSrc: Oral  SpO2: 98%  Weight: 167 lb 4.8 oz (75.9 kg)  Height: 5' 9.5 (1.765 m)   Body mass index is 24.35 kg/m.     11/27/2023   10:12 AM 11/07/2022   10:40 AM 10/24/2021    8:40 AM 10/20/2020   10:07 AM 07/05/2018   12:08 AM 06/18/2017    6:58 PM  Advanced Directives  Does Patient Have a Medical Advance Directive? Yes Yes Yes No No No  Type of Estate agent of [place];Living will Healthcare Power of [place];Living will Healthcare Power of [place];Living will     Copy of Healthcare Power of Attorney in Chart? No - copy requested No - copy requested No - copy requested     Would patient like information on creating a medical advance directive?    No - Patient declined No - Patient declined     Current Medications (verified) Outpatient Encounter Medications as of 11/27/2023  Medication Sig   allopurinol  (ZYLOPRIM ) 100 MG tablet Take 1 tablet (100 mg total) by mouth daily.   amLODipine  (NORVASC ) 5 MG tablet TAKE 1 TABLET (5 MG TOTAL) BY MOUTH DAILY.   atorvastatin  (LIPITOR) 20 MG tablet Takes 1 tab every other day at 6 pm   levothyroxine  (SYNTHROID ) 50 MCG tablet TAKE 1 TABLET BY MOUTH EVERY DAY   No facility-administered encounter medications on file as of 11/27/2023.     Allergies (verified) Patient has no known allergies.   History: Past Medical History:  Diagnosis Date   Allergy    slight    Arthritis    thumbs   Gout    Hyperlipidemia    Hypertension    Hypothyroidism    Multiple sclerosis (HCC)    Neuromuscular disorder (HCC)    MS    Past Surgical History:  Procedure Laterality Date   COLONOSCOPY     OTHER SURGICAL HISTORY     right thumb tendon surgery   Family History  Problem Relation Age of Onset   Pancreatic cancer Mother    Throat cancer Father    Prostate cancer Brother 37   Esophageal cancer Brother    Cancer Paternal Grandmother 19       unknown type   Heart disease Paternal Grandfather    Skin cancer Paternal Grandfather    Colon cancer Neg Hx    Colon polyps Neg Hx    Rectal cancer Neg Hx    Stomach cancer Neg Hx    Social History   Socioeconomic History   Marital status: Married    Spouse name: Not on file   Number of children: 2   Years of education: Law   Highest education level: Not on file  Occupational History   Occupation: attorney  Tobacco Use   Smoking status:  Never   Smokeless tobacco: Never  Vaping Use   Vaping status: Never Used  Substance and Sexual Activity   Alcohol use: Yes    Comment: every other day - 2 drinks   Drug use: No   Sexual activity: Not on file  Other Topics Concern   Not on file  Social History Narrative   Patient lives at home alone. Dating.   Caffeine Use: 1 cup every other day   Christian   No regular exercise, diet is so so   [employer].   Social Drivers of Corporate investment banker Strain: Low Risk  (11/27/2023)   Overall Financial Resource Strain (CARDIA)    Difficulty of Paying Living Expenses: Not hard at all  Food Insecurity: No Food Insecurity (11/27/2023)   Hunger Vital Sign    Worried About Running Out of Food in the Last Year: Never true    Ran Out of Food in the Last Year: Never true  Transportation Needs: No Transportation Needs  (11/27/2023)   PRAPARE - Administrator, Civil Service (Medical): No    Lack of Transportation (Non-Medical): No  Physical Activity: Insufficiently Active (11/27/2023)   Exercise Vital Sign    Days of Exercise per Week: 1 day    Minutes of Exercise per Session: 60 min  Stress: No Stress Concern Present (11/27/2023)   [employer] of Occupational Health - Occupational Stress Questionnaire    Feeling of Stress : Not at all  Social Connections: Socially Integrated (11/27/2023)   Social Connection and Isolation Panel [NHANES]    Frequency of Communication with Friends and Family: More than three times a week    Frequency of Social Gatherings with Friends and Family: More than three times a week    Attends Religious Services: More than 4 times per year    Active Member of [employer] or Organizations: Yes    Attends Engineer, structural: More than 4 times per year    Marital Status: Married    Tobacco Counseling Counseling given: Not Answered    Clinical Intake:  Pre-visit preparation completed: Yes  Pain : No/denies pain     BMI - recorded: 24.35 Nutritional Status: BMI of 19-24  Normal Nutritional Risks: None Diabetes: No  Lab Results  Component Value Date   HGBA1C 5.9 08/15/2020   HGBA1C 5.8 (H) 12/28/2019   HGBA1C 5.9 12/22/2018     How often do you need to have someone help you when you read instructions, pamphlets, or other written materials from your doctor or pharmacy?: 1 - Never  Interpreter Needed?: No  Information entered by :: Farris Hong LPN   Activities of Daily Living     11/27/2023   10:11 AM  In your present state of health, do you have any difficulty performing the following activities:  Hearing? 0  Vision? 0  Difficulty concentrating or making decisions? 0  Walking or climbing stairs? 0  Dressing or bathing? 0  Doing errands, shopping? 0  Preparing Food and eating ? N  Using the Toilet? N  In the past six months, have  you accidently leaked urine? Y  Comment Followed by PCP  Do you have problems with loss of bowel control? N  Managing your Medications? N  Managing your Finances? N  Housekeeping or managing your Housekeeping? N    Patient Care Team: Aida House, MD as PCP - General (Family Medicine) Syed, Tauseef G, MD (Rheumatology)  I have updated your Care Teams  any recent Medical Services you may have received from other providers in the past year.     Assessment:    This is a routine wellness examination for Mickie.  Hearing/Vision screen Hearing Screening - Comments:: Denies hearing difficulties   Vision Screening - Comments:: Wears rx glasses - up to date with routine eye exams with  [facility]   Goals Addressed               This Visit's Progress     Increase physical activity (pt-stated)        Remain active.       Depression Screen     11/27/2023    9:59 AM 11/07/2022   10:37 AM 10/24/2021    8:41 AM 10/20/2020   10:10 AM 10/20/2020   10:04 AM 08/15/2020    8:38 AM 06/29/2019    8:18 AM  PHQ 2/9 Scores  PHQ - 2 Score 0 0 0 0 0 0 0    Fall Risk     11/27/2023   10:12 AM 06/25/2023    1:59 PM 11/07/2022   10:40 AM 10/30/2022    9:46 AM 10/24/2021    8:40 AM  Fall Risk   Falls in the past year? 0 0 0 0 0  Number falls in past yr: 0 0 0 0 0  Injury with Fall? 0 0 0 0 0  Risk for fall due to : No Fall Risks No Fall Risks No Fall Risks  Medication side effect  Follow up Falls evaluation completed Falls evaluation completed Falls prevention discussed  Falls evaluation completed;Education provided;Falls prevention discussed    MEDICARE RISK AT HOME:  Medicare Risk at Home Any stairs in or around the home?: Yes If so, are there any without handrails?: No Home free of loose throw rugs in walkways, pet beds, electrical cords, etc?: Yes Adequate lighting in your home to reduce risk of falls?: Yes Life alert?: No Use of a cane, walker or w/c?: No Grab bars in the  bathroom?: No Shower chair or bench in shower?: Yes Elevated toilet seat or a handicapped toilet?: No  TIMED UP AND GO:  Was the test performed?  Yes  Length of time to ambulate 10 feet: 10 sec Gait steady and fast without use of assistive device  Cognitive Function: 6CIT completed        11/27/2023   10:12 AM 11/07/2022   10:41 AM  6CIT Screen  What Year? 0 points 0 points  What month? 0 points 0 points  What time? 0 points 0 points  Count back from 20 0 points 0 points  Months in reverse 0 points 0 points  Repeat phrase 0 points 0 points  Total Score 0 points 0 points    Immunizations Immunization History  Administered Date(s) Administered   Fluad Quad(high Dose 65+) 06/29/2019, 05/24/2022   Fluad Trivalent(High Dose 65+) 03/11/2023   Influenza Split 03/30/2011   Influenza Whole 05/20/2007, 03/20/2010   Influenza,inj,Quad PF,6+ Mos 05/06/2013, 07/20/2014, 05/16/2015, 05/13/2018   Influenza-Unspecified 03/03/2021   PFIZER(Purple Top)SARS-COV-2 Vaccination 07/08/2019, 07/28/2019, 04/03/2020, 12/02/2020   Pfizer Covid-19 Vaccine Bivalent Booster 56yrs & up 03/03/2021, 04/24/2022   Pneumococcal Conjugate-13 12/22/2018   Pneumococcal Polysaccharide-23 01/07/2020   RSV,unspecified 04/24/2022   Td 06/18/1998, 08/31/2009   Zoster, Live 05/31/2015    Screening Tests Health Maintenance  Topic Date Due   Zoster Vaccines- Shingrix  (1 of 2) 11/12/2003   DTaP/Tdap/Td (3 - Tdap) 09/01/2019   COVID-19 Vaccine (7 - 2024-25  season) 02/17/2023   Colonoscopy  11/09/2023   INFLUENZA VACCINE  01/17/2024   Medicare Annual Wellness (AWV)  11/26/2024   Pneumonia Vaccine 16+ Years old  Completed   Hepatitis C Screening  Addressed   HPV VACCINES  Aged Out   Meningococcal B Vaccine  Aged Out    Health Maintenance  Health Maintenance Due  Topic Date Due   Zoster Vaccines- Shingrix  (1 of 2) 11/12/2003   DTaP/Tdap/Td (3 - Tdap) 09/01/2019   COVID-19 Vaccine (7 - 2024-25 season)  02/17/2023   Colonoscopy  11/09/2023   Health Maintenance Items Addressed: Referral sent to GI for colonoscopy  Additional Screening:  Vision Screening: Recommended annual ophthalmology exams for early detection of glaucoma and other disorders of the eye. Would you like a referral to an eye doctor? No    Dental Screening: Recommended annual dental exams for proper oral hygiene  Community Resource Referral / Chronic Care Management: CRR required this visit?  No   CCM required this visit?  No   Plan:    I have personally reviewed and noted the following in the patient's chart:   Medical and social history Use of alcohol, tobacco or illicit drugs  Current medications and supplements including opioid prescriptions. Patient is not currently taking opioid prescriptions. Functional ability and status Nutritional status Physical activity Advanced directives List of other physicians Hospitalizations, surgeries, and ER visits in previous 12 months Vitals Screenings to include cognitive, depression, and falls Referrals and appointments  In addition, I have reviewed and discussed with patient certain preventive protocols, quality metrics, and best practice recommendations. A written personalized care plan for preventive services as well as general preventive health recommendations were provided to patient.   Dewayne Ford, LPN   1/61/0960   After Visit Summary: (In Person-Printed) AVS printed and given to the patient  Notes: Nothing significant to report at this time.

## 2023-12-30 ENCOUNTER — Other Ambulatory Visit (INDEPENDENT_AMBULATORY_CARE_PROVIDER_SITE_OTHER): Payer: Medicare Other

## 2023-12-30 DIAGNOSIS — E785 Hyperlipidemia, unspecified: Secondary | ICD-10-CM

## 2023-12-30 DIAGNOSIS — M109 Gout, unspecified: Secondary | ICD-10-CM

## 2023-12-30 LAB — LIPID PANEL
Cholesterol: 178 mg/dL (ref 0–200)
HDL: 40.6 mg/dL (ref >39.00)
LDL Cholesterol: 103 mg/dL — ABNORMAL HIGH (ref 0–99)
NonHDL: 137.66
Total CHOL/HDL Ratio: 4
Triglycerides: 174 mg/dL — ABNORMAL HIGH (ref 0.0–149.0)
VLDL: 34.8 mg/dL (ref 0.0–40.0)

## 2023-12-30 LAB — URIC ACID: Uric Acid, Serum: 7.4 mg/dL (ref 4.0–7.8)

## 2024-01-03 ENCOUNTER — Ambulatory Visit: Payer: Self-pay | Admitting: Family Medicine

## 2024-02-14 ENCOUNTER — Telehealth: Payer: Self-pay | Admitting: *Deleted

## 2024-02-14 ENCOUNTER — Ambulatory Visit (AMBULATORY_SURGERY_CENTER): Admitting: *Deleted

## 2024-02-14 VITALS — Ht 69.5 in | Wt 165.0 lb

## 2024-02-14 DIAGNOSIS — Z1211 Encounter for screening for malignant neoplasm of colon: Secondary | ICD-10-CM

## 2024-02-14 MED ORDER — NA SULFATE-K SULFATE-MG SULF 17.5-3.13-1.6 GM/177ML PO SOLN: 1.0000 | Freq: Once | ORAL | 0 refills | Status: AC

## 2024-02-14 NOTE — Telephone Encounter (Signed)
 Attempt to reach pt for pre-visit. LM with call back #.   Will attempt to reach again in 5 min due to no other # listed in profile  Second attempt to reach pt

## 2024-02-14 NOTE — Progress Notes (Signed)
  Pt's name and DOB verified at the beginning of the pre-visit with 2 identifiers  Pt denies any difficulty with ambulating,sitting, laying down or rolling side to side  Pt has no issues moving head neck or swallowing  No egg or soy allergy known to patient   No issues known to pt with past sedation  No FH of Malignant Hyperthermia  Pt is not on home 02   Pt is not on blood thinners   Pt denies issues with constipation   Pt is not on dialysis  Pt denise any abnormal heart rhythms   Pt denies any upcoming cardiac testing  Patient's chart reviewed by Norleen Schillings CNRA prior to pre-visit and patient appropriate for the [facility].  Pre-visit completed and red dot placed by patient's name on their procedure day (on provider's schedule).    Visit by phone Pt states weight is 165 lb    Pt given  both [facility] main # and MD on call # prior to instructions.  Informed pt to come in at the time discussed and is shown on PV instructions.  Pt instructed to use Singlecare.com or GoodRx for a price reduction on prep  Instructed pt to review instructions again prior to procedure and call main # given if has any questions or any issues. Pt states they will. Instructed pt where to find PV instructions in My Chart  Instructed pt on all aspects of written instructions including med holds clothing to wear and foods to eat and not eat as well as after procedure legal restrictions and to call MD on call if needed.. Pt states understanding.

## 2024-02-21 ENCOUNTER — Other Ambulatory Visit: Payer: Self-pay | Admitting: Family Medicine

## 2024-02-21 DIAGNOSIS — I10 Essential (primary) hypertension: Secondary | ICD-10-CM

## 2024-03-03 ENCOUNTER — Ambulatory Visit: Admitting: Gastroenterology

## 2024-03-03 ENCOUNTER — Encounter: Payer: Self-pay | Admitting: Gastroenterology

## 2024-03-03 VITALS — BP 122/78 | HR 67 | Temp 97.5°F | Resp 14 | Ht 69.5 in | Wt 165.0 lb

## 2024-03-03 DIAGNOSIS — K635 Polyp of colon: Secondary | ICD-10-CM | POA: Diagnosis not present

## 2024-03-03 DIAGNOSIS — D123 Benign neoplasm of transverse colon: Secondary | ICD-10-CM | POA: Diagnosis not present

## 2024-03-03 DIAGNOSIS — Z1211 Encounter for screening for malignant neoplasm of colon: Secondary | ICD-10-CM

## 2024-03-03 DIAGNOSIS — D122 Benign neoplasm of ascending colon: Secondary | ICD-10-CM

## 2024-03-03 DIAGNOSIS — K5730 Diverticulosis of large intestine without perforation or abscess without bleeding: Secondary | ICD-10-CM

## 2024-03-03 DIAGNOSIS — D125 Benign neoplasm of sigmoid colon: Secondary | ICD-10-CM

## 2024-03-03 MED ORDER — SODIUM CHLORIDE 0.9 % IV SOLN: 500.0000 mL | Freq: Once | INTRAVENOUS | Status: DC

## 2024-03-03 NOTE — Patient Instructions (Addendum)
 Handouts provided on polyps and diverticulosis.  Resume previous diet.  Continue present medications.  Await pathology results.  Repeat colonoscopy in 3 years after piecemeal polypectomy.   YOU HAD AN ENDOSCOPIC PROCEDURE TODAY AT THE [facility]:   Refer to the procedure report that was given to you for any specific questions about what was found during the examination.  If the procedure report does not answer your questions, please call your gastroenterologist to clarify.  If you requested that your care partner not be given the details of your procedure findings, then the procedure report has been included in a sealed envelope for you to review at your convenience later.  YOU SHOULD EXPECT: Some feelings of bloating in the abdomen. Passage of more gas than usual.  Walking can help get rid of the air that was put into your GI tract during the procedure and reduce the bloating. If you had a lower endoscopy (such as a colonoscopy or flexible sigmoidoscopy) you may notice spotting of blood in your stool or on the toilet paper. If you underwent a bowel prep for your procedure, you may not have a normal bowel movement for a few days.  Please Note:  You might notice some irritation and congestion in your nose or some drainage.  This is from the oxygen used during your procedure.  There is no need for concern and it should clear up in a day or so.  SYMPTOMS TO REPORT IMMEDIATELY:  Following lower endoscopy (colonoscopy or flexible sigmoidoscopy):  Excessive amounts of blood in the stool  Significant tenderness or worsening of abdominal pains  Swelling of the abdomen that is new, acute  Fever of 100F or higher  For urgent or emergent issues, a gastroenterologist can be reached at any hour by calling [phone]. Do not use MyChart messaging for urgent concerns.    DIET:  We do recommend a small meal at first, but then you may proceed to your regular diet.  Drink plenty of fluids  but you should avoid alcoholic beverages for 24 hours.  ACTIVITY:  You should plan to take it easy for the rest of today and you should NOT DRIVE or use heavy machinery until tomorrow (because of the sedation medicines used during the test).    FOLLOW UP: Our staff will call the number listed on your records the next business day following your procedure.  We will call around 7:15- 8:00 am to check on you and address any questions or concerns that you may have regarding the information given to you following your procedure. If we do not reach you, we will leave a message.     If any biopsies were taken you will be contacted by phone or by letter within the next 1-3 weeks.  Please call us  at [phone] if you have not heard about the biopsies in 3 weeks.    SIGNATURES/CONFIDENTIALITY: You and/or your care partner have signed paperwork which will be entered into your electronic medical record.  These signatures attest to the fact that that the information above on your After Visit Summary has been reviewed and is understood.  Full responsibility of the confidentiality of this discharge information lies with you and/or your care-partner.

## 2024-03-03 NOTE — Progress Notes (Unsigned)
 Pt's states no medical or surgical changes since previsit or office visit.

## 2024-03-03 NOTE — Op Note (Signed)
 [facility] Patient Name: Adam Oliver Procedure Date: 03/03/2024 8:24 AM MRN: [id number] Endoscopist: Gustav ALONSO Mcgee , MD, [id number] Age: 68 Referring MD:  Date of Birth: 1955/09/12 Gender: Male Account #: [id number] Procedure:                Colonoscopy Indications:              High risk colon cancer surveillance: Personal                            history of colonic polyps, High risk colon cancer                            surveillance: Personal history of adenoma (10 mm or                            greater in size), High risk colon cancer                            surveillance: Personal history of multiple (3 or                            more) adenomas Medicines:                Monitored Anesthesia Care Procedure:                Pre-Anesthesia Assessment:                           - Prior to the procedure, a History and Physical                            was performed, and patient medications and                            allergies were reviewed. The patient's tolerance of                            previous anesthesia was also reviewed. The risks                            and benefits of the procedure and the sedation                            options and risks were discussed with the patient.                            All questions were answered, and informed consent                            was obtained. Prior Anticoagulants: The patient has                            taken no anticoagulant or antiplatelet agents. ASA  Grade Assessment: II - A patient with mild systemic                            disease. After reviewing the risks and benefits,                            the patient was deemed in satisfactory condition to                            undergo the procedure.                           After obtaining informed consent, the colonoscope                            was passed under direct vision. Throughout the                             procedure, the patient's blood pressure, pulse, and                            oxygen saturations were monitored continuously. The                            PCF-HQ190L Colonoscope [id number] was introduced                            through the anus and advanced to the the cecum,                            identified by appendiceal orifice and ileocecal                            valve. The colonoscopy was performed without                            difficulty. The patient tolerated the procedure                            well. The quality of the bowel preparation was                            good. The ileocecal valve, appendiceal orifice, and                            rectum were photographed. Scope In: 8:40:23 AM Scope Out: 9:01:19 AM Scope Withdrawal Time: 0 hours 14 minutes 51 seconds  Total Procedure Duration: 0 hours 20 minutes 56 seconds  Findings:                 The perianal and digital rectal examinations were                            normal.  A few small-mouthed diverticula were found in the                            sigmoid colon.                           Four sessile polyps were found in the sigmoid                            colon, transverse colon and ascending colon. The                            polyps were 5 to 12 mm in size. These polyps were                            removed with a cold snare. Resection and retrieval                            were complete.                           The perianal and digital rectal examinations were                            normal. Complications:            No immediate complications. Estimated Blood Loss:     Estimated blood loss was minimal. Impression:               - Mild diverticulosis in the sigmoid colon.                           - Four 5 to 12 mm polyps in the sigmoid colon, in                            the transverse colon and in the ascending colon,                             removed with a cold snare. Resected and retrieved. Recommendation:           - Resume previous diet.                           - Continue present medications.                           - Await pathology results.                           - Repeat colonoscopy in 3 years for surveillance                            after piecemeal polypectomy. Oktober Glazer V. Cina Klumpp, MD 03/03/2024 9:13:37 AM This report has been signed electronically.

## 2024-03-03 NOTE — Progress Notes (Unsigned)
 To pacu, VSS. Report to [email or web address].tb

## 2024-03-03 NOTE — Progress Notes (Unsigned)
 [facility] History and Physical   Primary Care Physician:  Ozell Heron HERO, MD   Reason for Procedure:  History of adenomatous colon polyps  Plan:    Surveillance colonoscopy with possible interventions as needed     HPI: Adam Oliver is a very pleasant 68 y.o. male here for surveillance colonoscopy. Denies any nausea, vomiting, abdominal pain, melena or bright red blood per rectum  The risks and benefits as well as alternatives of endoscopic procedure(s) have been discussed and reviewed. All questions answered. The patient agrees to proceed.    Past Medical History:  Diagnosis Date   Allergy    slight    Arthritis    thumbs   Chronic kidney disease    kidney stones   Gout    Hyperlipidemia    Hypertension    Hypothyroidism    Multiple sclerosis (HCC)    Neuromuscular disorder (HCC)    MS     Past Surgical History:  Procedure Laterality Date   COLONOSCOPY     OTHER SURGICAL HISTORY     right thumb tendon surgery    Prior to Admission medications   Medication Sig Start Date End Date Taking? Authorizing Provider  allopurinol  (ZYLOPRIM ) 100 MG tablet Take 1 tablet (100 mg total) by mouth daily. 09/13/23  Yes Ozell Heron HERO, MD  amLODipine  (NORVASC ) 5 MG tablet TAKE 1 TABLET (5 MG TOTAL) BY MOUTH DAILY. 02/21/24  Yes Ozell Heron HERO, MD  atorvastatin  (LIPITOR) 20 MG tablet Takes 1 tab every other day at 6 pm 06/25/23  Yes Ozell Heron HERO, MD  levothyroxine  (SYNTHROID ) 50 MCG tablet TAKE 1 TABLET BY MOUTH EVERY DAY 02/21/24  Yes Ozell Heron HERO, MD  OVER THE COUNTER MEDICATION daily at 6 (six) AM. Eye vit   Yes [provider]    Current Outpatient Medications  Medication Sig Dispense Refill   allopurinol  (ZYLOPRIM ) 100 MG tablet Take 1 tablet (100 mg total) by mouth daily. 90 tablet 1   amLODipine  (NORVASC ) 5 MG tablet TAKE 1 TABLET (5 MG TOTAL) BY MOUTH DAILY. 90 tablet 0   atorvastatin  (LIPITOR) 20 MG tablet Takes 1 tab every  other day at 6 pm 90 tablet 1   levothyroxine  (SYNTHROID ) 50 MCG tablet TAKE 1 TABLET BY MOUTH EVERY DAY 90 tablet 0   OVER THE COUNTER MEDICATION daily at 6 (six) AM. Eye vit     Current Facility-Administered Medications  Medication Dose Route Frequency Provider Last Rate Last Admin   0.9 %  sodium chloride  infusion  500 mL Intravenous Once Reyana Leisey V, MD        Allergies as of 03/03/2024   (No Known Allergies)    Family History  Problem Relation Age of Onset   Pancreatic cancer Mother    Throat cancer Father    Prostate cancer Brother 58   Esophageal cancer Brother    Cancer Paternal Grandmother 82       unknown type   Heart disease Paternal Grandfather    Skin cancer Paternal Grandfather    Colon cancer Neg Hx    Colon polyps Neg Hx    Rectal cancer Neg Hx    Stomach cancer Neg Hx     Social History   Socioeconomic History   Marital status: Married    Spouse name: Not on file   Number of children: 2   Years of education: Law   Highest education level: Not on file  Occupational History   Occupation: attorney  Tobacco Use   Smoking status: Never   Smokeless tobacco: Never  Vaping Use   Vaping status: Never Used  Substance and Sexual Activity   Alcohol use: Yes    Comment: every other day - 2 drinks   Drug use: No   Sexual activity: Not on file  Other Topics Concern   Not on file  Social History Narrative   Patient lives at home alone. Dating.   Caffeine Use: 1 cup every other day   Christian   No regular exercise, diet is so so   [employer].   Social Drivers of Corporate investment banker Strain: Low Risk  (11/27/2023)   Overall Financial Resource Strain (CARDIA)    Difficulty of Paying Living Expenses: Not hard at all  Food Insecurity: No Food Insecurity (11/27/2023)   Hunger Vital Sign    Worried About Running Out of Food in the Last Year: Never true    Ran Out of Food in the Last Year: Never true  Transportation Needs: No  Transportation Needs (11/27/2023)   PRAPARE - Administrator, Civil Service (Medical): No    Lack of Transportation (Non-Medical): No  Physical Activity: Insufficiently Active (11/27/2023)   Exercise Vital Sign    Days of Exercise per Week: 1 day    Minutes of Exercise per Session: 60 min  Stress: No Stress Concern Present (11/27/2023)   [employer] of Occupational Health - Occupational Stress Questionnaire    Feeling of Stress : Not at all  Social Connections: Socially Integrated (11/27/2023)   Social Connection and Isolation Panel    Frequency of Communication with Friends and Family: More than three times a week    Frequency of Social Gatherings with Friends and Family: More than three times a week    Attends Religious Services: More than 4 times per year    Active Member of [employer] or Organizations: Yes    Attends Banker Meetings: More than 4 times per year    Marital Status: Married  Catering manager Violence: Not At Risk (11/27/2023)   Humiliation, Afraid, Rape, and Kick questionnaire    Fear of Current or Ex-Partner: No    Emotionally Abused: No    Physically Abused: No    Sexually Abused: No    Review of Systems:  All other review of systems negative except as mentioned in the HPI.  Physical Exam: Vital signs in last 24 hours: BP 126/82   Pulse 84   Temp (!) 97.5 F (36.4 C) (Temporal)   Ht 5' 9.5 (1.765 m)   Wt 165 lb (74.8 kg)   SpO2 96%   BMI 24.02 kg/m  General:   Alert, NAD Lungs:  Clear .   Heart:  Regular rate and rhythm Abdomen:  Soft, nontender and nondistended. Neuro/Psych:  Alert and cooperative. Normal mood and affect. A and O x 3  Reviewed labs, radiology imaging, old records and pertinent past GI work up  Patient is appropriate for planned procedure(s) and anesthesia in an ambulatory setting   K. Veena Jadon Harbaugh , MD [phone]

## 2024-03-03 NOTE — Progress Notes (Unsigned)
 Called to room to assist during endoscopic procedure.  Patient ID and intended procedure confirmed with present staff. Received instructions for my participation in the procedure from the performing physician.

## 2024-03-04 ENCOUNTER — Telehealth: Payer: Self-pay

## 2024-03-04 ENCOUNTER — Encounter: Payer: Self-pay | Admitting: Gastroenterology

## 2024-03-04 NOTE — Telephone Encounter (Signed)
no answer

## 2024-03-05 LAB — SURGICAL PATHOLOGY

## 2024-03-31 ENCOUNTER — Ambulatory Visit (INDEPENDENT_AMBULATORY_CARE_PROVIDER_SITE_OTHER): Admitting: Family Medicine

## 2024-03-31 ENCOUNTER — Encounter: Payer: Self-pay | Admitting: Family Medicine

## 2024-03-31 VITALS — BP 128/80 | HR 76 | Temp 98.0°F | Wt 170.0 lb

## 2024-03-31 DIAGNOSIS — L0231 Cutaneous abscess of buttock: Secondary | ICD-10-CM | POA: Diagnosis not present

## 2024-03-31 MED ORDER — DOXYCYCLINE HYCLATE 100 MG PO TABS: 100.0000 mg | ORAL_TABLET | Freq: Two times a day (BID) | ORAL | 0 refills | Status: AC

## 2024-03-31 NOTE — Progress Notes (Signed)
   Subjective:    Patient ID: Adam Oliver, male    DOB: Jun 23, 1955, 68 y.o.   MRN: [id number]  HPI Here for a tender lump on the right buttock that appeared 10 days ago. It has never drained. He has never had anything like this before.    Review of Systems  Constitutional: Negative.   Respiratory: Negative.    Cardiovascular: Negative.   Skin:  Positive for wound.       Objective:   Physical Exam Constitutional:      Appearance: Normal appearance.  Cardiovascular:     Rate and Rhythm: Normal rate and regular rhythm.     Pulses: Normal pulses.     Heart sounds: Normal heart sounds.  Pulmonary:     Effort: Pulmonary effort is normal.     Breath sounds: Normal breath sounds.  Skin:    Comments: There is a firm indurated slightly tender lump on the right buttock that has a small scab in the middle.   Neurological:     Mental Status: He is alert.           Assessment & Plan:  This is an abscess, and I think it it is likely the result of a spider bite. We will treat with 10 days of Doxycycline.  Garnette Olmsted, MD

## 2024-04-15 ENCOUNTER — Ambulatory Visit: Payer: Self-pay | Admitting: Gastroenterology

## 2024-05-19 ENCOUNTER — Other Ambulatory Visit: Payer: Self-pay | Admitting: Family Medicine

## 2024-05-19 DIAGNOSIS — I10 Essential (primary) hypertension: Secondary | ICD-10-CM

## 2024-06-10 ENCOUNTER — Other Ambulatory Visit: Payer: Self-pay | Admitting: Family Medicine

## 2024-06-10 DIAGNOSIS — M109 Gout, unspecified: Secondary | ICD-10-CM

## 2024-06-18 ENCOUNTER — Other Ambulatory Visit: Payer: Self-pay | Admitting: Family Medicine

## 2024-06-18 DIAGNOSIS — E785 Hyperlipidemia, unspecified: Secondary | ICD-10-CM

## 2024-07-13 ENCOUNTER — Encounter (INDEPENDENT_AMBULATORY_CARE_PROVIDER_SITE_OTHER): Payer: Medicare Other | Admitting: Ophthalmology

## 2024-07-21 ENCOUNTER — Encounter (INDEPENDENT_AMBULATORY_CARE_PROVIDER_SITE_OTHER): Admitting: Ophthalmology

## 2024-07-27 ENCOUNTER — Encounter (INDEPENDENT_AMBULATORY_CARE_PROVIDER_SITE_OTHER): Admitting: Ophthalmology

## 2024-12-02 ENCOUNTER — Ambulatory Visit
